# Patient Record
Sex: Female | Born: 1970 | Hispanic: Yes | Marital: Married | Smoking: Never smoker
Health system: Southern US, Community
[De-identification: ages and names within clinical notes are randomized; demographics above are authoritative.]

## PROBLEM LIST (undated history)

## (undated) DIAGNOSIS — R87619 Unspecified abnormal cytological findings in specimens from cervix uteri: Secondary | ICD-10-CM

## (undated) DIAGNOSIS — G43909 Migraine, unspecified, not intractable, without status migrainosus: Secondary | ICD-10-CM

## (undated) DIAGNOSIS — F419 Anxiety disorder, unspecified: Secondary | ICD-10-CM

## (undated) DIAGNOSIS — K828 Other specified diseases of gallbladder: Secondary | ICD-10-CM

## (undated) DIAGNOSIS — D649 Anemia, unspecified: Secondary | ICD-10-CM

## (undated) DIAGNOSIS — T7840XA Allergy, unspecified, initial encounter: Secondary | ICD-10-CM

## (undated) DIAGNOSIS — J020 Streptococcal pharyngitis: Secondary | ICD-10-CM

## (undated) HISTORY — DX: Anxiety disorder, unspecified: F41.9

## (undated) HISTORY — DX: Streptococcal pharyngitis: J02.0

## (undated) HISTORY — DX: Other specified diseases of gallbladder: K82.8

## (undated) HISTORY — DX: Migraine, unspecified, not intractable, without status migrainosus: G43.909

## (undated) HISTORY — DX: Anemia, unspecified: D64.9

## (undated) HISTORY — DX: Allergy, unspecified, initial encounter: T78.40XA

## (undated) HISTORY — DX: Unspecified abnormal cytological findings in specimens from cervix uteri: R87.619

---

## 2012-08-22 ENCOUNTER — Ambulatory Visit (INDEPENDENT_AMBULATORY_CARE_PROVIDER_SITE_OTHER): Payer: PRIVATE HEALTH INSURANCE | Admitting: Family Medicine

## 2012-08-22 ENCOUNTER — Encounter: Payer: Self-pay | Admitting: Family Medicine

## 2012-08-22 VITALS — BP 130/80 | HR 138 | Temp 98.2°F | Ht 62.5 in | Wt 180.0 lb

## 2012-08-22 DIAGNOSIS — R1013 Epigastric pain: Secondary | ICD-10-CM | POA: Insufficient documentation

## 2012-08-22 DIAGNOSIS — Z7189 Other specified counseling: Secondary | ICD-10-CM

## 2012-08-22 DIAGNOSIS — IMO0001 Reserved for inherently not codable concepts without codable children: Secondary | ICD-10-CM

## 2012-08-22 DIAGNOSIS — Z7689 Persons encountering health services in other specified circumstances: Secondary | ICD-10-CM

## 2012-08-22 DIAGNOSIS — Z309 Encounter for contraceptive management, unspecified: Secondary | ICD-10-CM

## 2012-08-22 LAB — HEMOGLOBIN A1C: Hgb A1c MFr Bld: 5.5 % (ref 4.6–6.5)

## 2012-08-22 NOTE — Progress Notes (Signed)
Chief Complaint  Patient presents with  . Establish Care    HPI:  Marcine Obi is here to establish care. Moved here from [place] about 1.5 years ago. Last PCP and physical: last physical about 2 years ago and was normal [facility] [place] [place]), hx of abnormal pap  Work: Audiological scientist Home Situation: lives with two children (44yo and 74 yo - coming to see me next week), father in [place] - they are separated Spiritual Beliefs: catholic Lifestyle: no regular cardiovascular, trying to eat a healthy diet  Has the following chronic problems and concerns today:  Patient Active Problem List  Diagnosis  . Epigastric pain  . Contraception  -has happened 3 times in the last  -occurs only at night -short lived -no nausea, vomiting, dysphagia, weight loss, change in bowels -regular periods, FDLMP 08/17/11, is sexually active -using injected birth control from [place] that she does herself but runs out the end of February - wants to do depo here  Health Maintenance: -had flu in nov 2013 -tdap in 2008  ROS: See pertinent positives and negatives per HPI.  History reviewed. No pertinent past medical history.  No family history on file.  History   Social History  . Marital Status: Single    Spouse Name: N/A    Number of Children: N/A  . Years of Education: N/A   Social History Main Topics  . Smoking status: Never Smoker   . Smokeless tobacco: None  . Alcohol Use: Yes     Comment: rare  . Drug Use: None  . Sexually Active: None   Other Topics Concern  . None   Social History Narrative  . None    No current outpatient prescriptions on file.  EXAM:  Filed Vitals:   08/22/12 1533  BP: 130/80  Pulse: 138  Temp: 98.2 F (36.8 C)    Body mass index is 32.40 kg/(m^2).  GENERAL: vitals reviewed and listed above, alert, oriented, appears well hydrated and in no acute distress  HEENT: atraumatic, conjunttiva clear, no obvious abnormalities on inspection of external  nose and ears  NECK: no obvious masses on inspection  LUNGS: clear to auscultation bilaterally, no wheezes, rales or rhonchi, good air movement  CV: HRRR, no peripheral edema  MS: moves all extremities without noticeable abnormality  PSYCH: pleasant and cooperative, no obvious depression or anxiety  ASSESSMENT AND PLAN:  Discussed the following assessment and plan:  1. Epigastric pain  -rare, brief, likley reflux since occurs at night -tums, journal, lifestyle changes - checking cmp -if worsens or persist will have her see GI CMP  2. Contraception  -will start dep at CPE   3. Establishing care with new doctor, encounter for  -CPE in one month CMP, Lipid Panel, Hemoglobin A1c  NON-FASTING LABS TODAY  -We reviewed the PMH, PSH, FH, SH, Meds and Allergies. -We addressed current concerns per orders and patient instructions. -We have asked for records for pertinent exams, studies, vaccines and notes from previous providers. -We have advised patient to follow up per instructions below.  -Patient advised to return or notify a doctor immediately if symptoms worsen or persist or new concerns arise.  Patient Instructions  -We have ordered labs or studies at this visit. It can take up to 1-2 weeks for results and processing. We will contact you with instructions IF your results are abnormal. Normal results will be released to your [facility]. If you have not heard from [place] or can not find your results  in [facility] in 2 weeks please contact our office.  -PLEASE SIGN UP FOR MYCHART TODAY   We recommend the following healthy lifestyle measures: - eat a healthy diet consisting of lots of vegetables, fruits, beans, nuts, seeds, healthy meats such as white chicken and fish and whole grains.  - avoid fried foods, fast food, processed foods, sodas, red meet and other fattening foods.  - get a least 150 minutes of aerobic exercise per week.   Follow up in: 1 month for CPE      Ezekiel Menzer  R.

## 2012-08-22 NOTE — Patient Instructions (Addendum)
-  We have ordered labs or studies at this visit. It can take up to 1-2 weeks for results and processing. We will contact you with instructions IF your results are abnormal. Normal results will be released to your [facility]. If you have not heard from [place] or can not find your results in [facility] in 2 weeks please contact our office.  -PLEASE SIGN UP FOR MYCHART TODAY   We recommend the following healthy lifestyle measures: - eat a healthy diet consisting of lots of vegetables, fruits, beans, nuts, seeds, healthy meats such as white chicken and fish and whole grains.  - avoid fried foods, fast food, processed foods, sodas, red meet and other fattening foods.  - get a least 150 minutes of aerobic exercise per week.   Follow up in: 1 month for CPE

## 2012-08-23 LAB — COMPREHENSIVE METABOLIC PANEL
ALT: 17 U/L (ref 0–35)
Albumin: 3.7 g/dL (ref 3.5–5.2)
CO2: 30 mEq/L (ref 19–32)
Calcium: 9.4 mg/dL (ref 8.4–10.5)
Chloride: 106 mEq/L (ref 96–112)
GFR: 78.43 mL/min (ref >60.00)
Glucose, Bld: 130 mg/dL — ABNORMAL HIGH (ref 70–99)
Potassium: 5 mEq/L (ref 3.5–5.1)
Sodium: 141 mEq/L (ref 135–145)
Total Bilirubin: 0.6 mg/dL (ref 0.3–1.2)
Total Protein: 7.9 g/dL (ref 6.0–8.3)

## 2012-08-23 LAB — LIPID PANEL: Cholesterol: 197 mg/dL (ref 0–200)

## 2012-09-22 ENCOUNTER — Encounter: Payer: PRIVATE HEALTH INSURANCE | Admitting: Family Medicine

## 2012-09-26 ENCOUNTER — Ambulatory Visit (INDEPENDENT_AMBULATORY_CARE_PROVIDER_SITE_OTHER): Payer: PRIVATE HEALTH INSURANCE | Admitting: Family Medicine

## 2012-09-26 ENCOUNTER — Encounter: Payer: Self-pay | Admitting: Family Medicine

## 2012-09-26 ENCOUNTER — Other Ambulatory Visit (HOSPITAL_COMMUNITY)
Admission: RE | Admit: 2012-09-26 | Discharge: 2012-09-26 | Disposition: A | Discharge status: Home / Self Care | Payer: PRIVATE HEALTH INSURANCE | Source: Ambulatory Visit | Attending: Family Medicine | Admitting: Family Medicine

## 2012-09-26 VITALS — BP 128/86 | HR 91 | Temp 98.4°F | Ht 62.5 in | Wt 182.0 lb

## 2012-09-26 DIAGNOSIS — Z01419 Encounter for gynecological examination (general) (routine) without abnormal findings: Secondary | ICD-10-CM | POA: Insufficient documentation

## 2012-09-26 DIAGNOSIS — Z0000 Encounter for general adult medical examination without abnormal findings: Secondary | ICD-10-CM

## 2012-09-26 DIAGNOSIS — Z309 Encounter for contraceptive management, unspecified: Secondary | ICD-10-CM

## 2012-09-26 DIAGNOSIS — Z1151 Encounter for screening for human papillomavirus (HPV): Secondary | ICD-10-CM | POA: Insufficient documentation

## 2012-09-26 NOTE — Progress Notes (Signed)
Chief Complaint  Patient presents with  . Annual Exam  . Cough    x 2 weeks;     HPI:  Here for CPE:  -Concerns today: mole on back - hasn't changed - has had for a long time - her mother thinks it has enlarged, sometimes itchy -wants to start depo shot - was on shot from [place] that she took every month (perlutal) - took last shot this month  -Diet: variety of foods, balance and well rounded  -Taking folic acid: no  -Exercise: elliptical 20 minutes daily  -Diabetes and Dyslipidemia Screening: done recently - mild dyslipidemia but not fasting  -Hx of HTN: no  -Vaccines: UTD  -pap history: ? Pap two years ago in [place], ? Remote abnormal pap with tx with cryo - normal since and reports normal pap two years ago in [place]  -FDLMP: 09/16/2012  -sexual activity: yes, female partner, no new partners  -wants STI testing: no  -FH breast, colon or ovarian ca: see FH  -Alcohol, Tobacco, drug use: see social history  Review of Systems - denies: CP, SOB, DOE, dizziness, unintentional weight loss, change in bowels, urinary or vaginal complaints, fevers, abnormal bleeding, worrisome skin lesions  Past Medical History  Diagnosis Date  . Migraines     Family History  Problem Relation Age of Onset  . Breast cancer      grandmother  . Hypertension      parent    History   Social History  . Marital Status: Single    Spouse Name: N/A    Number of Children: N/A  . Years of Education: N/A   Social History Main Topics  . Smoking status: Never Smoker   . Smokeless tobacco: None  . Alcohol Use: Yes     Comment: rare  . Drug Use: None  . Sexually Active: None   Other Topics Concern  . None   Social History Narrative  . None    No current outpatient prescriptions on file.  EXAM:  Filed Vitals:   09/26/12 1620  BP: 128/86  Pulse: 91  Temp: 98.4 F (36.9 C)    GENERAL: vitals reviewed and listed below, alert, oriented, appears well hydrated and in no acute  distress  HEENT: head atraumatic, PERRLA, normal appearance of eyes, ears, nose and mouth. moist mucus membranes.  NECK: supple, no masses or lymphadenopathy  LUNGS: clear to auscultation bilaterally, no rales, rhonchi or wheeze  CV: HRRR, no peripheral edema or cyanosis, normal pedal pulses  BREAST: normal appearance, refused exam  ABDOMEN: bowel sounds normal, soft, non tender to palpation, no masses, no rebound or guarding  GU: normal appearance of external genitalia - no lesions or masses, normal vaginal mucosa - no abnormal discharge, cervix appears somewhat friable - no abnormal discharge, no masses or tenderness on palpation of uterus and ovaries.  RECTAL: refused  SKIN: circular 8mm firm hyperpigmented lesion L upper back that dimples when squeezed  MS: normal gait, moves all extremities normally  NEURO: CN II-XII grossly intact, normal muscle strength and sensation to light touch on extremities  PSYCH: normal affect, pleasant and cooperative  ASSESSMENT AND PLAN:  Discussed the following assessment and plan:  Routine general medical examination at a health care facility - Plan: Cytology - PAP  -42 yo female her for yearly physical/health maintenance:  -Discussed and advised all [place] preventive services health task force level A and B recommendations for age, sex and risks.  -discussed contraceptive options and risks -  pt prefers to do depo, upreg neg and depo given today after discussion of risks. Pt greatly prefers this method to all other options discussed. She will consider the mirena and will contact gyn if decides to do this.  -for skin lesion, appears to be benign dermatofibroma, but given concern may have changed, advised I can not 100% exclude other etiologies including cancer. Given on difficult area of back cosmetically for healing and size of lesion, after discussion of options she will see derm about this if she decides to have it removed. Phone numbers to  call to schedule appt provided.  -Advised at least 150 minutes of exercise per week and a healthy diet low in saturated fats and sweets and consisting of fresh fruits and vegetables, lean meats such as fish and white chicken and whole grains.  -labs, studies and vaccines per orders this encounter  No orders of the defined types were placed in this encounter.    Patient Instructions  We recommend the following healthy lifestyle measures: - eat a healthy diet consisting of lots of vegetables, fruits, beans, nuts, seeds, healthy meats such as white chicken and fish and whole grains.  - avoid fried foods, fast food, processed foods, sodas, red meet and other fattening foods.  - get a least 150 minutes of aerobic exercise per week.   For the lesion on your back - I would recommend since are unsure if has changed that you have the dermatologist look at it.  -We have ordered a pap smear at this visit. It can take up to 1-2 weeks for results and processing. We will contact you with instructions IF your results are abnormal. Normal results will be released to your [facility]. If you have not heard from [place] or can not find your results in [facility] in 2 weeks please contact our office.  -schedule a nurse visit in 12 weeks for your next depo shot  -follow up in 6 months         Patient advised to return to clinic immediately if symptoms worsen or persist or new concerns.  @[email or web address] @  No Follow-up on file.  [name] R.

## 2012-09-26 NOTE — Patient Instructions (Signed)
We recommend the following healthy lifestyle measures: - eat a healthy diet consisting of lots of vegetables, fruits, beans, nuts, seeds, healthy meats such as white chicken and fish and whole grains.  - avoid fried foods, fast food, processed foods, sodas, red meet and other fattening foods.  - get a least 150 minutes of aerobic exercise per week.   For the lesion on your back - I would recommend since are unsure if has changed that you have the dermatologist look at it.  -We have ordered a pap smear at this visit. It can take up to 1-2 weeks for results and processing. We will contact you with instructions IF your results are abnormal. Normal results will be released to your [facility]. If you have not heard from [place] or can not find your results in [facility] in 2 weeks please contact our office.  -schedule a nurse visit in 12 weeks for your next depo shot  -follow up in 6 months

## 2012-09-27 MED ORDER — MEDROXYPROGESTERONE ACETATE 150 MG/ML IM SUSP
150.0000 mg | Freq: Once | INTRAMUSCULAR | Status: AC
  Administered 2012-09-26: 150 mg via INTRAMUSCULAR

## 2012-09-27 NOTE — Addendum Note (Signed)
Addended by: Azucena Freed on: 09/27/2012 08:37 AM   Modules accepted: Orders

## 2012-10-01 ENCOUNTER — Telehealth: Payer: Self-pay | Admitting: Family Medicine

## 2012-10-01 NOTE — Telephone Encounter (Signed)
Pap smear was negative and HPV was negative. Advise repeating in 3 years.

## 2012-10-02 NOTE — Telephone Encounter (Signed)
Called and spoke with pt and pt is aware.  

## 2012-12-08 ENCOUNTER — Ambulatory Visit (INDEPENDENT_AMBULATORY_CARE_PROVIDER_SITE_OTHER): Payer: PRIVATE HEALTH INSURANCE

## 2012-12-08 DIAGNOSIS — Z309 Encounter for contraceptive management, unspecified: Secondary | ICD-10-CM

## 2012-12-08 MED ORDER — MEDROXYPROGESTERONE ACETATE 150 MG/ML IM SUSP
150.0000 mg | Freq: Once | INTRAMUSCULAR | Status: AC
  Administered 2012-12-08: 150 mg via INTRAMUSCULAR

## 2013-02-23 ENCOUNTER — Ambulatory Visit (INDEPENDENT_AMBULATORY_CARE_PROVIDER_SITE_OTHER): Payer: PRIVATE HEALTH INSURANCE

## 2013-02-23 DIAGNOSIS — Z309 Encounter for contraceptive management, unspecified: Secondary | ICD-10-CM

## 2013-02-23 MED ORDER — MEDROXYPROGESTERONE ACETATE 150 MG/ML IM SUSP
150.0000 mg | Freq: Once | INTRAMUSCULAR | Status: AC
  Administered 2013-02-23: 150 mg via INTRAMUSCULAR

## 2013-04-12 ENCOUNTER — Ambulatory Visit (INDEPENDENT_AMBULATORY_CARE_PROVIDER_SITE_OTHER): Payer: PRIVATE HEALTH INSURANCE | Admitting: Family Medicine

## 2013-04-12 ENCOUNTER — Encounter: Payer: Self-pay | Admitting: Family Medicine

## 2013-04-12 VITALS — BP 130/88 | Temp 98.4°F | Wt 191.0 lb

## 2013-04-12 DIAGNOSIS — R071 Chest pain on breathing: Secondary | ICD-10-CM

## 2013-04-12 DIAGNOSIS — R635 Abnormal weight gain: Secondary | ICD-10-CM

## 2013-04-12 DIAGNOSIS — IMO0001 Reserved for inherently not codable concepts without codable children: Secondary | ICD-10-CM

## 2013-04-12 DIAGNOSIS — R0789 Other chest pain: Secondary | ICD-10-CM

## 2013-04-12 DIAGNOSIS — Z309 Encounter for contraceptive management, unspecified: Secondary | ICD-10-CM

## 2013-04-12 LAB — TSH: TSH: 1.12 u[IU]/mL (ref 0.35–5.50)

## 2013-04-12 LAB — T4, FREE: Free T4: 1.12 ng/dL (ref 0.60–1.60)

## 2013-04-12 NOTE — Progress Notes (Signed)
Chief Complaint  Patient presents with  . Discuss BC options    HPI:  Acute visit for contraception/weight gain: -has been on depo shot -denies hx of blood clots, htn, migraine with aura -has had weight gain mild over last year -wonders if birth control, but want to continue depo -wants to check thyroid -denies: hot/cold flashes, malaise, bleeding  Cartilage pain: -has started doing elipitical and has had some soreness in L costal cartilage for 3 days  Health maintenace: -offered flu -advised mammogram -she is getting this in october ROS: See pertinent positives and negatives per HPI.  Past Medical History  Diagnosis Date  . Migraines     Past Surgical History  Procedure Laterality Date  . Cesarean section  2008    Family History  Problem Relation Age of Onset  . Breast cancer      grandmother  . Hypertension      parent    History   Social History  . Marital Status: Single    Spouse Name: N/A    Number of Children: N/A  . Years of Education: N/A   Social History Main Topics  . Smoking status: Never Smoker   . Smokeless tobacco: None  . Alcohol Use: Yes     Comment: rare  . Drug Use: None  . Sexual Activity: None   Other Topics Concern  . None   Social History Narrative  . None    No current outpatient prescriptions on file.  EXAM:  Filed Vitals:   04/12/13 1516  BP: 130/88  Temp: 98.4 F (36.9 C)    Body mass index is 34.36 kg/(m^2).  GENERAL: vitals reviewed and listed above, alert, oriented, appears well hydrated and in no acute distress  HEENT: atraumatic, conjunttiva clear, no obvious abnormalities on inspection of external nose and ears  NECK: no obvious masses on inspection  LUNGS: clear to auscultation bilaterally, no wheezes, rales or rhonchi, good air movement  CV: HRRR, no peripheral edema  Chest: TTP mild cartilage L chest wall  MS: moves all extremities without noticeable abnormality  PSYCH: pleasant and  cooperative, no obvious depression or anxiety  ASSESSMENT AND PLAN:  Discussed the following assessment and plan:  Weight gain - Plan: TSH, T4, Free  Contraception  Chest wall pain  -discussed weight, advised of options for birth control including non-hormonal options, she wants to continue with depo -advised 150-387minutes of CV exercise per week and low carb low fat healthy diet and portion control -checking thyroid labs -advised supportive measures for chest wall pain and return precuations -advised regular mammos and flu vaccine -Patient advised to return or notify a doctor immediately if symptoms worsen or persist or new concerns arise.  There are no Patient Instructions on file for this visit.   [name] R.

## 2013-04-13 NOTE — Progress Notes (Signed)
Quick Note:  Called and spoke with pt and pt is aware. ______ 

## 2013-05-18 ENCOUNTER — Ambulatory Visit (INDEPENDENT_AMBULATORY_CARE_PROVIDER_SITE_OTHER): Payer: PRIVATE HEALTH INSURANCE

## 2013-05-18 DIAGNOSIS — Z309 Encounter for contraceptive management, unspecified: Secondary | ICD-10-CM

## 2013-05-18 MED ORDER — MEDROXYPROGESTERONE ACETATE 150 MG/ML IM SUSP
150.0000 mg | Freq: Once | INTRAMUSCULAR | Status: AC
  Administered 2013-05-18: 150 mg via INTRAMUSCULAR

## 2013-05-31 ENCOUNTER — Encounter: Payer: Self-pay | Admitting: Family Medicine

## 2013-05-31 ENCOUNTER — Ambulatory Visit (INDEPENDENT_AMBULATORY_CARE_PROVIDER_SITE_OTHER): Payer: PRIVATE HEALTH INSURANCE | Admitting: Family Medicine

## 2013-05-31 VITALS — BP 108/80 | HR 76 | Temp 103.1°F | Ht 62.5 in | Wt 192.0 lb

## 2013-05-31 DIAGNOSIS — J020 Streptococcal pharyngitis: Secondary | ICD-10-CM

## 2013-05-31 DIAGNOSIS — J029 Acute pharyngitis, unspecified: Secondary | ICD-10-CM

## 2013-05-31 HISTORY — DX: Streptococcal pharyngitis: J02.0

## 2013-05-31 LAB — POCT RAPID STREP A (OFFICE): Rapid Strep A Screen: POSITIVE — AB

## 2013-05-31 MED ORDER — IBUPROFEN 200 MG PO TABS: 400.0000 mg | ORAL_TABLET | Freq: Every day | ORAL | Status: DC | PRN

## 2013-05-31 MED ORDER — CEFDINIR 300 MG PO CAPS: 300.0000 mg | ORAL_CAPSULE | Freq: Two times a day (BID) | ORAL | Status: AC

## 2013-05-31 NOTE — Assessment & Plan Note (Signed)
Rapid strep positive with fever and pain, started on Cefdinir and probiotics, increase rest and hydration off work through [employer]

## 2013-05-31 NOTE — Patient Instructions (Signed)
Strep Throat Strep throat is an infection of the throat caused by a bacteria named Streptococcus pyogenes. Your caregiver may call the infection streptococcal "tonsillitis" or "pharyngitis" depending on whether there are signs of inflammation in the tonsils or back of the throat. Strep throat is most common in children aged 44 15 years during the cold months of the year, but it can occur in people of any age during any season. This infection is spread from person to person (contagious) through coughing, sneezing, or other close contact. SYMPTOMS   Fever or chills.  Painful, swollen, red tonsils or throat.  Pain or difficulty when swallowing.  White or yellow spots on the tonsils or throat.  Swollen, tender lymph nodes or "glands" of the neck or under the jaw.  Red rash all over the body (rare). DIAGNOSIS  Many different infections can cause the same symptoms. A test must be done to confirm the diagnosis so the right treatment can be given. A "rapid strep test" can help your caregiver make the diagnosis in a few minutes. If this test is not available, a light swab of the infected area can be used for a throat culture test. If a throat culture test is done, results are usually available in a day or two. TREATMENT  Strep throat is treated with antibiotic medicine. HOME CARE INSTRUCTIONS   Gargle with 1 tsp of salt in 1 cup of warm water, 3 4 times per day or as needed for comfort.  Family members who also have a sore throat or fever should be tested for strep throat and treated with antibiotics if they have the strep infection.  Make sure everyone in your household washes their hands well.  Do not share food, drinking cups, or personal items that could cause the infection to spread to others.  You may need to eat a soft food diet until your sore throat gets better.  Drink enough water and fluids to keep your urine clear or pale yellow. This will help prevent dehydration.  Get plenty of  rest.  Stay home from school, daycare, or work until you have been on antibiotics for 24 hours.  Only take over-the-counter or prescription medicines for pain, discomfort, or fever as directed by your caregiver.  If antibiotics are prescribed, take them as directed. Finish them even if you start to feel better. SEEK MEDICAL CARE IF:   The glands in your neck continue to enlarge.  You develop a rash, cough, or earache.  You cough up green, yellow-brown, or bloody sputum.  You have pain or discomfort not controlled by medicines.  Your problems seem to be getting worse rather than better. SEEK IMMEDIATE MEDICAL CARE IF:   You develop any new symptoms such as vomiting, severe headache, stiff or painful neck, chest pain, shortness of breath, or trouble swallowing.  You develop severe throat pain, drooling, or changes in your voice.  You develop swelling of the neck, or the skin on the neck becomes red and tender.  You have a fever.  You develop signs of dehydration, such as fatigue, dry mouth, and decreased urination.  You become increasingly sleepy, or you cannot wake up completely. Document Released: 07/23/2000 Document Revised: 07/12/2012 Document Reviewed: 09/24/2010 [facility] Patient Information 2014 [place], [place]. Strep Infections Streptococcal (strep) infections are caused by streptococcal germs (bacteria). Strep infections are very contagious. Strep infections can occur in:  Ears.  The nose.  The throat.  Sinuses.  Skin.  Blood.  Lungs.  Spinal fluid.  Urine. Strep throat is the most common bacterial infection in children. The symptoms of a Strep infection usually get better in 2 to 3 days after starting medicine that kills germs (antibiotics). Strep is usually not contagious after 36 to 48 hours of antibiotic treatment. Strep infections that are not treated can cause serious complications. These include gland infections, throat abscess, rheumatic fever and  kidney disease. DIAGNOSIS  The diagnosis of strep is made by:  A culture for the strep germ. TREATMENT  These infections require oral antibiotics for a full 10 days, an antibiotic shot or antibiotics given into the vein (intravenous, IV). HOME CARE INSTRUCTIONS   Be sure to finish all antibiotics even if feeling better.  Only take over-the-counter medicines for pain, discomfort and or fever, as directed by your caregiver.  Close contacts that have a fever, sore throat or illness symptoms should see their caregiver right away.  You or your child may return to work, school or daycare if the fever and pain are better in 2 to 3 days after starting antibiotics. SEEK MEDICAL CARE IF:   You or your child has an oral temperature above 102 F (38.9 C).  Your baby is older than 3 months with a rectal temperature of 100.5 F (38.1 C) or higher for more than 1 day.  You or your child is not better in 3 days. SEEK IMMEDIATE MEDICAL CARE IF:   You or your child has an oral temperature above 102 F (38.9 C), not controlled by medicine.  Your baby is older than 3 months with a rectal temperature of 102 F (38.9 C) or higher.  Your baby is 68 months old or younger with a rectal temperature of 100.4 F (38 C) or higher.  There is a spreading rash.  There is difficulty swallowing or breathing.  There is increased pain or swelling. Document Released: 09/02/2004 Document Revised: 10/18/2011 Document Reviewed: 06/11/2009 [facility] Patient Information 2014 [place], [place].

## 2013-05-31 NOTE — Progress Notes (Signed)
Patient ID: [name], female   DOB: Mar 04, 1971, 42 y.o.   MRN: [id number] Ariel Hudson [id number] 09-14-1970 05/31/2013      Progress Note-Follow Up  Subjective  Chief Complaint  Chief Complaint  Patient presents with  . Sore Throat    X 2 days  . Fever    X 2days  . Generalized Body Aches    X 2 days    HPI  The patient is a 42 year old Hispanic female who is in today complaining of 2 days worth of worsening sore throat, fevers, chills, malaise and myalgias. She has a low grade headache but no significant cough, ear pain, congestion. No chest congestion, chest pain shortness of breath, diarrhea or vomiting. She has been working long hours and sleeping poorly so she does believe this has contributed to her current illness  Past Medical History  Diagnosis Date  . Migraines   . Streptococcal sore throat 05/31/2013    Past Surgical History  Procedure Laterality Date  . Cesarean section  2008    Family History  Problem Relation Age of Onset  . Breast cancer      grandmother  . Hypertension      parent    History   Social History  . Marital Status: Single    Spouse Name: N/A    Number of Children: N/A  . Years of Education: N/A   Occupational History  . Not on file.   Social History Main Topics  . Smoking status: Never Smoker   . Smokeless tobacco: Not on file  . Alcohol Use: Yes     Comment: rare  . Drug Use: Not on file  . Sexual Activity: Not on file   Other Topics Concern  . Not on file   Social History Narrative  . No narrative on file    No current outpatient prescriptions on file prior to visit.   No current facility-administered medications on file prior to visit.    No Known Allergies  Review of Systems  Review of Systems  Constitutional: Positive for fever and malaise/fatigue.  HENT: Positive for sore throat. Negative for congestion.   Eyes: Negative for discharge.  Respiratory: Negative for shortness of breath.    Cardiovascular: Negative for chest pain, palpitations and leg swelling.  Gastrointestinal: Negative for nausea, abdominal pain and diarrhea.  Genitourinary: Negative for dysuria.  Musculoskeletal: Positive for myalgias. Negative for falls.  Skin: Negative for rash.  Neurological: Positive for headaches. Negative for loss of consciousness.  Endo/Heme/Allergies: Negative for polydipsia.  Psychiatric/Behavioral: Negative for depression and suicidal ideas. The patient is not nervous/anxious and does not have insomnia.     Objective  BP 108/80  Pulse 76  Temp(Src) 103.1 F (39.5 C) (Oral)  Ht 5' 2.5" (1.588 m)  Wt 192 lb (87.091 kg)  BMI 34.54 kg/m2  SpO2 98%  Physical Exam  Physical Exam  Constitutional: She is oriented to person, place, and time and well-developed, well-nourished, and in no distress. No distress.  HENT:  Head: Normocephalic and atraumatic.  Oropharynx erythematous and swollen no exudate  Eyes: Conjunctivae are normal.  Neck: Neck supple. No thyromegaly present.  Cardiovascular: Normal rate, regular rhythm and normal heart sounds.   No murmur heard. Pulmonary/Chest: Effort normal and breath sounds normal. She has no wheezes.  Abdominal: She exhibits no distension and no mass.  Musculoskeletal: She exhibits no edema.  Lymphadenopathy:    She has cervical adenopathy.  Neurological: She is alert and oriented to person, place, and  time.  Skin: Skin is warm and dry. No rash noted. She is not diaphoretic.  Psychiatric: Memory, affect and judgment normal.    Lab Results  Component Value Date   TSH 1.12 04/12/2013   No results found for this basename: WBC, HGB, HCT, MCV, PLT   Lab Results  Component Value Date   CREATININE 0.8 08/22/2012   BUN 9 08/22/2012   NA 141 08/22/2012   K 5.0 08/22/2012   CL 106 08/22/2012   CO2 30 08/22/2012   Lab Results  Component Value Date   ALT 17 08/22/2012   AST 19 08/22/2012   ALKPHOS 98 08/22/2012   BILITOT 0.6 08/22/2012    Lab Results  Component Value Date   CHOL 197 08/22/2012   Lab Results  Component Value Date   HDL 50.20 08/22/2012   Lab Results  Component Value Date   LDLCALC 114* 08/22/2012   Lab Results  Component Value Date   TRIG 166.0* 08/22/2012   Lab Results  Component Value Date   CHOLHDL 4 08/22/2012     Assessment & Plan  Streptococcal sore throat Rapid strep positive with fever and pain, started on Cefdinir and probiotics, increase rest and hydration off work through [employer]

## 2013-06-01 ENCOUNTER — Ambulatory Visit: Payer: PRIVATE HEALTH INSURANCE | Admitting: Family Medicine

## 2013-06-12 ENCOUNTER — Encounter: Payer: Self-pay | Admitting: Family Medicine

## 2013-06-14 ENCOUNTER — Other Ambulatory Visit: Payer: Self-pay

## 2013-07-30 ENCOUNTER — Ambulatory Visit (INDEPENDENT_AMBULATORY_CARE_PROVIDER_SITE_OTHER): Payer: PRIVATE HEALTH INSURANCE

## 2013-07-30 DIAGNOSIS — Z309 Encounter for contraceptive management, unspecified: Secondary | ICD-10-CM

## 2013-07-30 MED ORDER — MEDROXYPROGESTERONE ACETATE 150 MG/ML IM SUSP
150.0000 mg | Freq: Once | INTRAMUSCULAR | Status: AC
  Administered 2013-07-30: 150 mg via INTRAMUSCULAR

## 2013-10-10 ENCOUNTER — Telehealth: Payer: Self-pay | Admitting: Family Medicine

## 2013-10-10 NOTE — Telephone Encounter (Signed)
Pt called wanting to schedule her next depo shot but could not remember when you told her to come.  Her last shot was 07/30/13.  Please advise

## 2013-10-11 NOTE — Telephone Encounter (Signed)
March 9th - March 23rd.  Called to make pt aware. Pt is aware of appt on 10/22/13 at 8 am.

## 2013-10-22 ENCOUNTER — Ambulatory Visit (INDEPENDENT_AMBULATORY_CARE_PROVIDER_SITE_OTHER): Payer: PRIVATE HEALTH INSURANCE

## 2013-10-22 DIAGNOSIS — Z309 Encounter for contraceptive management, unspecified: Secondary | ICD-10-CM

## 2013-10-22 MED ORDER — MEDROXYPROGESTERONE ACETATE 150 MG/ML IM SUSP
150.0000 mg | Freq: Once | INTRAMUSCULAR | Status: AC
Start: 2013-10-22 — End: 2013-10-22
  Administered 2013-10-22: 150 mg via INTRAMUSCULAR

## 2014-01-18 ENCOUNTER — Ambulatory Visit (INDEPENDENT_AMBULATORY_CARE_PROVIDER_SITE_OTHER): Payer: PRIVATE HEALTH INSURANCE | Admitting: *Deleted

## 2014-01-18 DIAGNOSIS — Z309 Encounter for contraceptive management, unspecified: Secondary | ICD-10-CM

## 2014-01-18 MED ORDER — MEDROXYPROGESTERONE ACETATE 150 MG/ML IM SUSP
150.0000 mg | Freq: Once | INTRAMUSCULAR | Status: AC
  Administered 2014-01-18: 150 mg via INTRAMUSCULAR

## 2014-04-23 ENCOUNTER — Telehealth: Payer: Self-pay | Admitting: *Deleted

## 2014-04-23 ENCOUNTER — Ambulatory Visit (INDEPENDENT_AMBULATORY_CARE_PROVIDER_SITE_OTHER): Payer: PRIVATE HEALTH INSURANCE | Admitting: *Deleted

## 2014-04-23 DIAGNOSIS — Z3200 Encounter for pregnancy test, result unknown: Secondary | ICD-10-CM

## 2014-04-23 DIAGNOSIS — Z3049 Encounter for surveillance of other contraceptives: Secondary | ICD-10-CM

## 2014-04-23 DIAGNOSIS — Z3042 Encounter for surveillance of injectable contraceptive: Secondary | ICD-10-CM

## 2014-04-23 LAB — POCT URINE PREGNANCY: Preg Test, Ur: NEGATIVE

## 2014-04-23 MED ORDER — MEDROXYPROGESTERONE ACETATE 150 MG/ML IM SUSP
150.0000 mg | Freq: Once | INTRAMUSCULAR | Status: AC
  Administered 2014-04-23: 150 mg via INTRAMUSCULAR

## 2014-04-23 NOTE — Telephone Encounter (Signed)
Patient came in today for a Depo-Provera injection and I advised her she was due on 9/11 and a urine pregnancy test was done per protocol.  She stated she had intercourse 2 days ago. The urine pregnancy test was negative and the pt was informed she could be pregnant and she decided to have the injection today and declines a repeat pregnancy test in 2 weeks.  She was informed to use a back up method for the next 7 days and agreed to this.

## 2014-05-17 ENCOUNTER — Ambulatory Visit (INDEPENDENT_AMBULATORY_CARE_PROVIDER_SITE_OTHER): Payer: PRIVATE HEALTH INSURANCE | Admitting: *Deleted

## 2014-05-17 ENCOUNTER — Ambulatory Visit: Payer: PRIVATE HEALTH INSURANCE | Admitting: *Deleted

## 2014-05-17 DIAGNOSIS — Z23 Encounter for immunization: Secondary | ICD-10-CM

## 2014-06-10 ENCOUNTER — Encounter: Payer: Self-pay | Admitting: Family Medicine

## 2014-06-27 ENCOUNTER — Telehealth: Payer: Self-pay | Admitting: Family Medicine

## 2014-06-27 NOTE — Telephone Encounter (Signed)
Please let the patient know I will try to get to her but this is not a good time since we always have a patient at 8am and 8:15-so she may have a wait.  Its best to come in at 9am.

## 2014-06-27 NOTE — Telephone Encounter (Signed)
Pt would like to arrive at 8:10 for her depo inj, s that ok Ariel Hudson/you

## 2014-07-12 ENCOUNTER — Ambulatory Visit (INDEPENDENT_AMBULATORY_CARE_PROVIDER_SITE_OTHER): Payer: PRIVATE HEALTH INSURANCE | Admitting: *Deleted

## 2014-07-12 DIAGNOSIS — Z309 Encounter for contraceptive management, unspecified: Secondary | ICD-10-CM

## 2014-07-12 MED ORDER — MEDROXYPROGESTERONE ACETATE 150 MG/ML IM SUSP
150.0000 mg | Freq: Once | INTRAMUSCULAR | Status: AC
  Administered 2014-07-12: 150 mg via INTRAMUSCULAR

## 2014-09-25 ENCOUNTER — Telehealth: Payer: Self-pay | Admitting: *Deleted

## 2014-09-25 NOTE — Telephone Encounter (Signed)
I called the pt and left a detailed message for her to call the office to reschedule the Depo-Provera injection for Friday (2/19) instead of Thursday as this would be too early.

## 2014-09-26 ENCOUNTER — Ambulatory Visit: Payer: PRIVATE HEALTH INSURANCE | Admitting: *Deleted

## 2014-10-02 ENCOUNTER — Ambulatory Visit (INDEPENDENT_AMBULATORY_CARE_PROVIDER_SITE_OTHER): Payer: BLUE CROSS/BLUE SHIELD | Admitting: *Deleted

## 2014-10-02 DIAGNOSIS — Z308 Encounter for other contraceptive management: Secondary | ICD-10-CM

## 2014-10-02 MED ORDER — MEDROXYPROGESTERONE ACETATE 150 MG/ML IM SUSP
150.0000 mg | Freq: Once | INTRAMUSCULAR | Status: AC
  Administered 2014-10-02: 150 mg via INTRAMUSCULAR

## 2014-11-22 ENCOUNTER — Telehealth: Payer: Self-pay | Admitting: Family Medicine

## 2014-11-22 NOTE — Telephone Encounter (Signed)
Pt would like to know the date range she needs to come in for her depo inj.  Pt lost her info you gave her.

## 2014-11-22 NOTE — Telephone Encounter (Signed)
Dr Remonia RichterKim-when is the pt due for an office visit with you?  Last seen 2014.

## 2014-11-22 NOTE — Telephone Encounter (Signed)
[facility] as I could not find an opening during the date of 5/12-5/26 for her to come in at the same time she is due for next Depo-Provera injection.

## 2014-11-22 NOTE — Telephone Encounter (Signed)
Needs CPE 

## 2014-11-22 NOTE — Telephone Encounter (Signed)
Your first available am appt is in

## 2014-12-23 ENCOUNTER — Ambulatory Visit (INDEPENDENT_AMBULATORY_CARE_PROVIDER_SITE_OTHER): Payer: BLUE CROSS/BLUE SHIELD | Admitting: *Deleted

## 2014-12-23 DIAGNOSIS — Z3042 Encounter for surveillance of injectable contraceptive: Secondary | ICD-10-CM | POA: Diagnosis not present

## 2014-12-23 MED ORDER — MEDROXYPROGESTERONE ACETATE 150 MG/ML IM SUSP
150.0000 mg | Freq: Once | INTRAMUSCULAR | Status: AC
  Administered 2014-12-23: 150 mg via INTRAMUSCULAR

## 2015-03-11 ENCOUNTER — Encounter: Payer: Self-pay | Admitting: Family Medicine

## 2015-03-11 ENCOUNTER — Ambulatory Visit (INDEPENDENT_AMBULATORY_CARE_PROVIDER_SITE_OTHER): Payer: BLUE CROSS/BLUE SHIELD | Admitting: Family Medicine

## 2015-03-11 VITALS — BP 102/78 | HR 86 | Temp 98.3°F | Ht 63.0 in | Wt 182.8 lb

## 2015-03-11 DIAGNOSIS — Z309 Encounter for contraceptive management, unspecified: Secondary | ICD-10-CM | POA: Diagnosis not present

## 2015-03-11 DIAGNOSIS — E669 Obesity, unspecified: Secondary | ICD-10-CM

## 2015-03-11 DIAGNOSIS — Z0000 Encounter for general adult medical examination without abnormal findings: Secondary | ICD-10-CM | POA: Diagnosis not present

## 2015-03-11 LAB — COMPREHENSIVE METABOLIC PANEL
ALBUMIN: 4.1 g/dL (ref 3.5–5.2)
ALT: 16 U/L (ref 0–35)
AST: 15 U/L (ref 0–37)
Alkaline Phosphatase: 99 U/L (ref 39–117)
BILIRUBIN TOTAL: 0.9 mg/dL (ref 0.2–1.2)
BUN: 9 mg/dL (ref 6–23)
CO2: 27 mEq/L (ref 19–32)
Calcium: 9.8 mg/dL (ref 8.4–10.5)
Chloride: 107 mEq/L (ref 96–112)
Creatinine, Ser: 0.81 mg/dL (ref 0.40–1.20)
GFR: 80.86 mL/min (ref >60.00)
Glucose, Bld: 102 mg/dL — ABNORMAL HIGH (ref 70–99)
POTASSIUM: 5.2 meq/L — AB (ref 3.5–5.1)
Sodium: 141 mEq/L (ref 135–145)
TOTAL PROTEIN: 7.9 g/dL (ref 6.0–8.3)

## 2015-03-11 LAB — LIPID PANEL
CHOL/HDL RATIO: 4
Cholesterol: 182 mg/dL (ref 0–200)
HDL: 48.8 mg/dL (ref >39.00)
LDL CALC: 113 mg/dL — AB (ref 0–99)
NonHDL: 133.61
TRIGLYCERIDES: 102 mg/dL (ref 0.0–149.0)
VLDL: 20.4 mg/dL (ref 0.0–40.0)

## 2015-03-11 LAB — HEMOGLOBIN A1C: Hgb A1c MFr Bld: 5.3 % (ref 4.6–6.5)

## 2015-03-11 MED ORDER — MEDROXYPROGESTERONE ACETATE 150 MG/ML IM SUSP
150.0000 mg | Freq: Once | INTRAMUSCULAR | Status: AC
  Administered 2015-03-11: 150 mg via INTRAMUSCULAR

## 2015-03-11 NOTE — Patient Instructions (Signed)
BEFORE YOU LEAVE: -labs  Vit D3 1000 IU daily  -We have ordered labs or studies at this visit. It can take up to 1-2 weeks for results and processing. We will contact you with instructions IF your results are abnormal. Normal results will be released to your [facility]. If you have not heard from [place] or can not find your results in [facility] in 2 weeks please contact our office.  -PLEASE SIGN UP FOR MYCHART TODAY   We recommend the following healthy lifestyle measures: - eat a healthy diet consisting of lots of vegetables, fruits, beans, nuts, seeds, healthy meats such as white chicken and fish and whole grains.  - avoid fried foods, fast food, processed foods, sodas, red meet and other fattening foods.  - get a least 150 minutes of aerobic exercise per week.

## 2015-03-11 NOTE — Addendum Note (Signed)
Addended by: Johnella Moloney on: 03/11/2015 08:46 AM   Modules accepted: Orders

## 2015-03-11 NOTE — Progress Notes (Signed)
Pre visit review using our clinic review tool, if applicable. No additional management support is needed unless otherwise documented below in the visit note. 

## 2015-03-11 NOTE — Progress Notes (Signed)
HPI:  Here for CPE. Has not been seen in almost 2 years.  -Concerns and/or follow up today: none  -Diet: variety of foods, balance and well rounded, larger portion sizes  -Exercise: no regular exercise  -Taking folic acid, vitamin D or calcium: no  -Diabetes and Dyslipidemia Screening: doing today  -Hx of HTN: no  -Vaccines: UTD - flu vaccine not yet avaible  -pap history: 09/2012, normal, HPV negative  -FDLMP:  No periods, on depo  -sexual activity: yes, female partner, no new partners  -wants STI testing (Hep C if born [birth year]): no  -FH breast, colon or ovarian ca: see FH Last mammogram: done Last colon cancer screening: n/a  Breast Ca Risk Assessment: -no sig FH or personal history, pt does self breast exams, she did a mammogram 1.5 years ago  -Alcohol, Tobacco, drug use: see social history  Review of Systems - no fevers, unintentional weight loss, vision loss, hearing loss, chest pain, sob, hemoptysis, melena, hematochezia, hematuria, genital discharge, changing or concerning skin lesions, bleeding, bruising, loc, thoughts of self harm or SI  Past Medical History  Diagnosis Date  . Migraines   . Streptococcal sore throat 05/31/2013  . Gallbladder sludge     dx in 2015 per pt, pt refused surgery    Past Surgical History  Procedure Laterality Date  . Cesarean section  2008    Family History  Problem Relation Age of Onset  . Breast cancer      grandmother  . Hypertension Father   . Heart defect Mother     recently admitted to [facility] per patient-needs valve replacement    History   Social History  . Marital Status: Single    Spouse Name: N/A  . Number of Children: N/A  . Years of Education: N/A   Social History Main Topics  . Smoking status: Never Smoker   . Smokeless tobacco: Not on file  . Alcohol Use: Yes     Comment: rare  . Drug Use: Not on file  . Sexual Activity: Not on file   Other Topics Concern  . None   Social History  Narrative     Current outpatient prescriptions:  .  medroxyPROGESTERone (DEPO-PROVERA) 150 MG/ML injection, Inject 150 mg into the muscle every 3 (three) months., Disp: , Rfl:   EXAM:  Filed Vitals:   03/11/15 0759  BP: 102/78  Pulse: 86  Temp: 98.3 F (36.8 C)    GENERAL: vitals reviewed and listed below, alert, oriented, appears well hydrated and in no acute distress  HEENT: head atraumatic, PERRLA, normal appearance of eyes, ears, nose and mouth. moist mucus membranes.  NECK: supple, no masses or lymphadenopathy  LUNGS: clear to auscultation bilaterally, no rales, rhonchi or wheeze  CV: HRRR, no peripheral edema or cyanosis, normal pedal pulses  BREAST: normal appearance - no lesions or discharge, on palpation normal breast tissue without any suspicious masses  ABDOMEN: bowel sounds normal, soft, non tender to palpation, no masses, no rebound or guarding  GU: declined  RECTAL: refused  SKIN: no rash or abnormal lesions  MS: normal gait, moves all extremities normally  NEURO: CN II-XII grossly intact, normal muscle strength and sensation to light touch on extremities  PSYCH: normal affect, pleasant and cooperative  ASSESSMENT AND PLAN:  Discussed the following assessment and plan:  There are no diagnoses linked to this encounter.  -Discussed and advised all [place] preventive services health task force level A and B recommendations for age, sex and  risks.  -Advised at least 150 minutes of exercise per week and a healthy diet low in saturated fats and sweets and consisting of fresh fruits and vegetables, lean meats such as fish and white chicken and whole grains.  -FASTING labs, studies and vaccines per orders this encounter  Orders Placed This Encounter  Procedures  . Hemoglobin A1c  . Lipid Panel  . CMP    Patient advised to return to clinic immediately if symptoms worsen or persist or new concerns.  Patient Instructions  BEFORE YOU LEAVE: -labs  Vit  D3 1000 IU daily  -We have ordered labs or studies at this visit. It can take up to 1-2 weeks for results and processing. We will contact you with instructions IF your results are abnormal. Normal results will be released to your [facility]. If you have not heard from [place] or can not find your results in [facility] in 2 weeks please contact our office.  -PLEASE SIGN UP FOR MYCHART TODAY   We recommend the following healthy lifestyle measures: - eat a healthy diet consisting of lots of vegetables, fruits, beans, nuts, seeds, healthy meats such as white chicken and fish and whole grains.  - avoid fried foods, fast food, processed foods, sodas, red meet and other fattening foods.  - get a least 150 minutes of aerobic exercise per week.      Return in about 1 year (around 03/10/2016) for CPE with pap.  [name] R.

## 2015-05-30 ENCOUNTER — Ambulatory Visit (INDEPENDENT_AMBULATORY_CARE_PROVIDER_SITE_OTHER): Payer: BLUE CROSS/BLUE SHIELD | Admitting: *Deleted

## 2015-05-30 DIAGNOSIS — Z309 Encounter for contraceptive management, unspecified: Secondary | ICD-10-CM | POA: Diagnosis not present

## 2015-05-30 MED ORDER — MEDROXYPROGESTERONE ACETATE 150 MG/ML IM SUSP
150.0000 mg | Freq: Once | INTRAMUSCULAR | Status: AC
  Administered 2015-05-30: 150 mg via INTRAMUSCULAR

## 2015-08-25 ENCOUNTER — Ambulatory Visit (INDEPENDENT_AMBULATORY_CARE_PROVIDER_SITE_OTHER): Payer: BLUE CROSS/BLUE SHIELD | Admitting: *Deleted

## 2015-08-25 DIAGNOSIS — Z3042 Encounter for surveillance of injectable contraceptive: Secondary | ICD-10-CM | POA: Diagnosis not present

## 2015-08-25 MED ORDER — MEDROXYPROGESTERONE ACETATE 150 MG/ML IM SUSP
150.0000 mg | Freq: Once | INTRAMUSCULAR | Status: AC
  Administered 2015-08-25: 150 mg via INTRAMUSCULAR

## 2015-11-17 ENCOUNTER — Ambulatory Visit (INDEPENDENT_AMBULATORY_CARE_PROVIDER_SITE_OTHER): Payer: BLUE CROSS/BLUE SHIELD | Admitting: *Deleted

## 2015-11-17 DIAGNOSIS — Z3042 Encounter for surveillance of injectable contraceptive: Secondary | ICD-10-CM | POA: Diagnosis not present

## 2015-11-17 MED ORDER — MEDROXYPROGESTERONE ACETATE 150 MG/ML IM SUSP
150.0000 mg | Freq: Once | INTRAMUSCULAR | Status: AC
  Administered 2015-11-17: 150 mg via INTRAMUSCULAR

## 2016-02-06 ENCOUNTER — Ambulatory Visit (INDEPENDENT_AMBULATORY_CARE_PROVIDER_SITE_OTHER): Payer: BLUE CROSS/BLUE SHIELD | Admitting: *Deleted

## 2016-02-06 DIAGNOSIS — Z3042 Encounter for surveillance of injectable contraceptive: Secondary | ICD-10-CM

## 2016-02-06 MED ORDER — MEDROXYPROGESTERONE ACETATE 150 MG/ML IM SUSP
150.0000 mg | Freq: Once | INTRAMUSCULAR | Status: AC
  Administered 2016-02-06: 150 mg via INTRAMUSCULAR

## 2016-03-11 ENCOUNTER — Ambulatory Visit (INDEPENDENT_AMBULATORY_CARE_PROVIDER_SITE_OTHER): Payer: BLUE CROSS/BLUE SHIELD | Admitting: Family Medicine

## 2016-03-11 ENCOUNTER — Encounter: Payer: Self-pay | Admitting: Family Medicine

## 2016-03-11 ENCOUNTER — Other Ambulatory Visit (HOSPITAL_COMMUNITY)
Admission: RE | Admit: 2016-03-11 | Discharge: 2016-03-11 | Disposition: A | Discharge status: Home / Self Care | Payer: BLUE CROSS/BLUE SHIELD | Source: Ambulatory Visit | Attending: Family Medicine | Admitting: Family Medicine

## 2016-03-11 VITALS — BP 100/70 | HR 79 | Temp 98.6°F | Ht 62.5 in | Wt 193.0 lb

## 2016-03-11 DIAGNOSIS — Z1151 Encounter for screening for human papillomavirus (HPV): Secondary | ICD-10-CM | POA: Insufficient documentation

## 2016-03-11 DIAGNOSIS — Z01419 Encounter for gynecological examination (general) (routine) without abnormal findings: Secondary | ICD-10-CM | POA: Diagnosis not present

## 2016-03-11 DIAGNOSIS — Z309 Encounter for contraceptive management, unspecified: Secondary | ICD-10-CM

## 2016-03-11 DIAGNOSIS — Z124 Encounter for screening for malignant neoplasm of cervix: Secondary | ICD-10-CM

## 2016-03-11 DIAGNOSIS — Z6834 Body mass index (BMI) 34.0-34.9, adult: Secondary | ICD-10-CM

## 2016-03-11 DIAGNOSIS — Z0000 Encounter for general adult medical examination without abnormal findings: Secondary | ICD-10-CM | POA: Diagnosis not present

## 2016-03-11 DIAGNOSIS — R69 Illness, unspecified: Secondary | ICD-10-CM

## 2016-03-11 NOTE — Patient Instructions (Signed)
BEFORE YOU LEAVE: -follow up: yearly and as needed -labs  Get your flu vaccine when it is available in the early fall  We recommend the following healthy lifestyle: 1) Small portions - eat off of salad plate instead of dinner plate 2) Eat a healthy clean diet with avoidance of (less then 1 serving per week) processed foods, sweetened drinks, white starches, red meat, fast foods and sweets and consisting of: * 5-9 servings per day of fresh or frozen fruits and vegetables (not corn or potatoes, not dried or canned) *nuts and seeds, beans *olives and olive oil *small portions of lean meats such as fish and white chicken  *small portions of whole grains 3)Get at least 150 minutes of sweaty aerobic exercise per week 4)reduce stress - counseling, meditation, relaxation to balance other aspects of your life  We have ordered labs or studies at this visit. It can take up to 1-2 weeks for results and processing. IF results require follow up or explanation, we will call you with instructions. Clinically stable results will be released to your [facility]. If you have not heard from [place] or cannot find your results in [facility] in 2 weeks please contact our office at [phone].  If you are not yet signed up for [facility], please consider signing up.

## 2016-03-11 NOTE — Progress Notes (Signed)
Here for CPE:  -Concerns and/or follow up today: none Mild hyperlipidemia. On depo for pregnancy prevention.did not tolerate ocps. Aware of risks.  -Diet: variety of foods, balance and well rounded, larger portion sizes  -Exercise: no regular exercise  -Taking folic acid, vitamin D or calcium: no  -Diabetes and Dyslipidemia Screening: prefers to do non-fasting today  -Hx of HTN: no  -Vaccines: UTD  -pap history: normal 09/2012 with hpv   -FDLMP: no periods, on depo  -sexual activity: yes, female partner, no new partners  -wants STI testing (Hep C if born [birth year]): no  -FH breast, colon or ovarian ca: see FH Last mammogram: reports does at [employer] at work q 2 years, last one 1.5 years Last colon cancer screening: n/a   -Alcohol, Tobacco, drug use: see social history  Review of Systems - no fevers, unintentional weight loss, vision loss, hearing loss, chest pain, sob, hemoptysis, melena, hematochezia, hematuria, genital discharge, changing or concerning skin lesions, bleeding, bruising, loc, thoughts of self harm or SI      Past Medical History:  Diagnosis Date  . 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 Migraines   . Streptococcal sore throat 05/31/2013         Past Surgical History:  Procedure Laterality Date  . CESAREAN SECTION  2008          Family History  Problem Relation Age of Onset  . Breast cancer      grandmother  . Hypertension Father   . Heart defect Mother     recently admitted to [facility] per patient-needs valve replacement    Social History        Social History  . Marital status: Single    Spouse name: N/A  . Number of children: N/A  . Years of education: N/A           Social History Main Topics    . Smoking status: Never Smoker   . Smokeless tobacco: Not on file   . Alcohol use Yes      Comment: rare    . Drug use: Unknown  . Sexual activity: Not on file         Other Topics Concern  . Not on file      Social History Narrative  . No narrative on file     Current Outpatient Prescriptions:  .  medroxyPROGESTERone (DEPO-PROVERA) 150 MG/ML injection, Inject 150 mg into the muscle every 3 (three) months., Disp: , Rfl:   EXAM:  There were no vitals filed for this visit.  GENERAL: vitals reviewed and listed below, alert, oriented, appears well hydrated and in no acute distress  HEENT: head atraumatic, PERRLA, normal appearance of eyes, ears, nose and mouth. moist mucus membranes.  NECK: supple, no masses or lymphadenopathy  LUNGS: clear to auscultation bilaterally, no rales, rhonchi or wheeze  CV: HRRR, no peripheral edema or cyanosis, normal pedal pulses  BREAST: normal appearance - no lesions or discharge, on palpation normal breast tissue without any suspicious masses  ABDOMEN: bowel sounds normal, soft, non tender to palpation, no masses, no rebound or guarding  GU: normal appearance of external genitalia - no lesions or masses, normal vaginal mucosa - no abnormal discharge, normal appearance of cervix - no lesions or abnormal discharge, no masses or tenderness on palpation of uterus and ovaries.  RECTAL: refused  SKIN: no rash or abnormal lesions  MS: normal gait, moves all extremities normally  NEURO: normal gait, speech and thought processing grossly intact, muscle tone grossly intact throughout  PSYCH: normal affect, pleasant and cooperative  ASSESSMENT AND PLAN:  Discussed the following assessment and plan:  Encounter for preventive health examination - Plan: Hemoglobin A1c, CBC (no diff), TSH, HDL cholesterol, Cholesterol, Total, CANCELED: Lipid Panel  Encounter for contraceptive management, unspecified encounter  BMI 34.0-34.9,adult  -Discussed and advised all [place] preventive services health task force level A and B recommendations for age, sex and risks.  -per current UpToDate review  today, depo can be continued until mid 50s and no FSH or BMD testing needed.  -Advised at least 150 minutes of exercise per week and a healthy diet with avoidance of (less then 1 serving per week) processed foods, white starches, red meat, fast foods and sweets and consisting of: * 5-9 servings of fresh fruits and vegetables (not corn or potatoes) *nuts and seeds, beans *olives and olive oil *lean meats such as fish and white chicken  *whole grains  -NONfasting labs, studies and vaccines per orders this encounter  No orders of the defined types were placed in this encounter.   Patient advised to return to clinic immediately if symptoms worsen or persist or new concerns.  There are no Patient Instructions on file for this visit.  No Follow-up on file.  [name] R., DO

## 2016-03-11 NOTE — Addendum Note (Signed)
Addended by: Johnella Moloney on: 03/11/2016 03:34 PM   Modules accepted: Orders

## 2016-03-11 NOTE — Progress Notes (Signed)
Pre visit review using our clinic review tool, if applicable. No additional management support is needed unless otherwise documented below in the visit note. 

## 2016-03-11 NOTE — Progress Notes (Addendum)
Duplicate note created by my friend EPIC.

## 2016-03-12 ENCOUNTER — Other Ambulatory Visit: Payer: BLUE CROSS/BLUE SHIELD

## 2016-03-12 LAB — CBC
HCT: 39.7 % (ref 36.0–46.0)
HEMOGLOBIN: 13.3 g/dL (ref 12.0–15.0)
MCHC: 33.4 g/dL (ref 30.0–36.0)
MCV: 90.7 fl (ref 78.0–100.0)
PLATELETS: 377 10*3/uL (ref 150.0–400.0)
RBC: 4.38 Mil/uL (ref 3.87–5.11)
RDW: 13.7 % (ref 11.5–15.5)
WBC: 9.9 10*3/uL (ref 4.0–10.5)

## 2016-03-12 LAB — HEMOGLOBIN A1C: Hgb A1c MFr Bld: 5.4 % (ref 4.6–6.5)

## 2016-03-12 LAB — TSH: TSH: 1.31 u[IU]/mL (ref 0.35–4.50)

## 2016-03-12 LAB — HDL CHOLESTEROL: HDL: 46.4 mg/dL (ref >39.00)

## 2016-03-12 LAB — CHOLESTEROL, TOTAL: CHOLESTEROL: 181 mg/dL (ref 0–200)

## 2016-03-15 LAB — CYTOLOGY - PAP

## 2016-04-29 ENCOUNTER — Ambulatory Visit (INDEPENDENT_AMBULATORY_CARE_PROVIDER_SITE_OTHER): Payer: BLUE CROSS/BLUE SHIELD | Admitting: *Deleted

## 2016-04-29 DIAGNOSIS — Z3042 Encounter for surveillance of injectable contraceptive: Secondary | ICD-10-CM

## 2016-04-29 MED ORDER — MEDROXYPROGESTERONE ACETATE 150 MG/ML IM SUSP
150.0000 mg | Freq: Once | INTRAMUSCULAR | Status: AC
  Administered 2016-04-29: 150 mg via INTRAMUSCULAR

## 2016-05-12 DIAGNOSIS — Z23 Encounter for immunization: Secondary | ICD-10-CM | POA: Diagnosis not present

## 2016-06-01 DIAGNOSIS — H40013 Open angle with borderline findings, low risk, bilateral: Secondary | ICD-10-CM | POA: Diagnosis not present

## 2016-07-30 ENCOUNTER — Ambulatory Visit: Payer: BLUE CROSS/BLUE SHIELD | Admitting: Emergency Medicine

## 2016-08-03 ENCOUNTER — Ambulatory Visit (INDEPENDENT_AMBULATORY_CARE_PROVIDER_SITE_OTHER): Payer: BLUE CROSS/BLUE SHIELD

## 2016-08-03 DIAGNOSIS — Z309 Encounter for contraceptive management, unspecified: Secondary | ICD-10-CM

## 2016-08-03 DIAGNOSIS — Z0000 Encounter for general adult medical examination without abnormal findings: Secondary | ICD-10-CM | POA: Diagnosis not present

## 2016-08-03 MED ORDER — MEDROXYPROGESTERONE ACETATE 150 MG/ML IM SUSP
150.0000 mg | Freq: Once | INTRAMUSCULAR | Status: AC
  Administered 2016-08-03: 150 mg via INTRAMUSCULAR

## 2016-08-03 NOTE — Progress Notes (Signed)
Patient is late for her Depo- provera Injection that was due 07/29/16. Obtained urine from the patient for a pregnancy test. Patient was given Depo-Provera at 10am by Harriett SineNancy CMA.

## 2016-08-04 LAB — POCT URINE PREGNANCY: Preg Test, Ur: NEGATIVE

## 2016-10-22 ENCOUNTER — Ambulatory Visit (INDEPENDENT_AMBULATORY_CARE_PROVIDER_SITE_OTHER): Payer: BLUE CROSS/BLUE SHIELD | Admitting: *Deleted

## 2016-10-22 DIAGNOSIS — Z3042 Encounter for surveillance of injectable contraceptive: Secondary | ICD-10-CM | POA: Diagnosis not present

## 2016-10-22 MED ORDER — MEDROXYPROGESTERONE ACETATE 150 MG/ML IM SUSP
150.0000 mg | Freq: Once | INTRAMUSCULAR | Status: AC
  Administered 2016-10-22: 150 mg via INTRAMUSCULAR

## 2017-01-14 ENCOUNTER — Ambulatory Visit (INDEPENDENT_AMBULATORY_CARE_PROVIDER_SITE_OTHER): Payer: BLUE CROSS/BLUE SHIELD | Admitting: *Deleted

## 2017-01-14 DIAGNOSIS — Z3042 Encounter for surveillance of injectable contraceptive: Secondary | ICD-10-CM

## 2017-01-14 MED ORDER — MEDROXYPROGESTERONE ACETATE 150 MG/ML IM SUSP
150.0000 mg | Freq: Once | INTRAMUSCULAR | Status: AC
  Administered 2017-01-14: 150 mg via INTRAMUSCULAR

## 2017-03-17 NOTE — Progress Notes (Signed)
HPI:  Here for CPE:  -Concerns and/or follow up today: none  -Diet: variety of foods, balance and well rounded, reports she has been eating better and has cut out soda, he'll love simple starches and has not been able to cut these out yet -Exercise: Walking 2 miles per day Continues on the Depo for pregnancy prevention and is doing well. She did not tolerate oral contraceptions and likes but discrete method of the Depakote injection. No history of fractures, smoking, family history of osteoporosis or other risk factors for osteoporosis. She is taking vitamin D daily and is getting weightbearing exercise.  -Taking folic acid, vitamin D or calcium: no  -Diabetes and Dyslipidemia Screening: Fasting for labs  -Hx of HTN: no  -Vaccines: UTD, she plans to get her flu shot later in season   -pap history: 03/2016 - neg, hpv neg  -FDLMP:Not applicable, on Depo  -sexual activity: yes, female partner, no new partners  -wants STI testing (Hep C if born [birth year]): no  -FH breast, colon or ovarian ca: see FH Last mammogram: 2014; she plans to schedule a mammogram this year, does self breast exams monthly and has no concerns, declines a clinical breast exam today  Last colon cancer screening:Not applicable     -Alcohol, Tobacco, drug use: see social history  Review of Systems - no fevers, unintentional weight loss, vision loss, hearing loss, chest pain, sob, hemoptysis, melena, hematochezia, hematuria, genital discharge, changing or concerning skin lesions, bleeding, bruising, loc, thoughts of self harm or SI  Past Medical History:  Diagnosis Date  . 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 Migraines   . Streptococcal sore throat 05/31/2013    Past Surgical History:  Procedure Laterality Date  . CESAREAN SECTION  2008    Family History  Problem Relation Age of Onset  . Breast cancer Unknown        grandmother  . Hypertension Father   . Heart defect Mother         recently admitted to [facility] per patient-needs valve replacement    Social History   Social History  . Marital status: Single    Spouse name: N/A  . Number of children: N/A  . Years of education: N/A   Social History Main Topics  . Smoking status: Never Smoker  . Smokeless tobacco: Never Used  . Alcohol use Yes     Comment: rare  . Drug use: Unknown  . Sexual activity: Not Asked   Other Topics Concern  . None   Social History Narrative  . None     Current Outpatient Prescriptions:  .  medroxyPROGESTERone (DEPO-PROVERA) 150 MG/ML injection, Inject 150 mg into the muscle every 3 (three) months., Disp: , Rfl:   EXAM:  Vitals:   03/18/17 0904  BP: 102/60  Pulse: 65  Temp: 99.1 F (37.3 C)    GENERAL: vitals reviewed and listed below, alert, oriented, appears well hydrated and in no acute distress  HEENT: head atraumatic, PERRLA, normal appearance of eyes, ears, nose and mouth. moist mucus membranes.  NECK: supple, no masses or lymphadenopathy  LUNGS: clear to auscultation bilaterally, no rales, rhonchi or wheeze  CV: HRRR, no peripheral edema or cyanosis, normal pedal pulses  ABDOMEN: bowel sounds normal, soft, non tender to palpation, no masses, no rebound or guarding  SKIN: no rash or abnormal lesions  MS: normal gait, moves all extremities normally  NEURO: normal gait, speech and thought processing  grossly intact, muscle tone grossly intact throughout  PSYCH: normal affect, pleasant and cooperative  ASSESSMENT AND PLAN:  Discussed the following assessment and plan:  Encounter for preventative adult health care examination - Plan: Hemoglobin A1c, Lipid panel  It is -Discussed and advised all [place] preventive services health task force level A and B recommendations for age, sex and risks.  -Advised at least 150 minutes of exercise per week and a healthy diet with avoidance of (less then 1 serving per week) processed foods, white starches, red  meat, fast foods and sweets and consisting of: * 5-9 servings of fresh fruits and vegetables (not corn or potatoes) *nuts and seeds, beans *olives and olive oil *lean meats such as fish and white chicken  *whole grains  -labs, studies and vaccines per orders this encounter  Orders Placed This Encounter  Procedures  . Hemoglobin A1c  . Lipid panel    Patient advised to return to clinic immediately if symptoms worsen or persist or new concerns.  Patient Instructions  BEFORE YOU LEAVE: -follow up: Yearly for your physical -Nurse visit for flu shot in October -Labs  We have ordered labs or studies at this visit. It can take up to 1-2 weeks for results and processing. IF results require follow up or explanation, we will call you with instructions. Clinically stable results will be released to your [facility]. If you have not heard from [place] or cannot find your results in [facility] in 2 weeks please contact our office at [phone].  If you are not yet signed up for [facility] please consider signing up.  Health Maintenance, Female Adopting a healthy lifestyle and getting preventive care can go a long way to promote health and wellness. Talk with your health care provider about what schedule of regular examinations is right for you. This is a good chance for you to check in with your provider about disease prevention and staying healthy. In between checkups, there are plenty of things you can do on your own. Experts have done a lot of research about which lifestyle changes and preventive measures are most likely to keep you healthy. Ask your health care provider for more information. Weight and diet Eat a healthy diet  Be sure to include plenty of vegetables, fruits, low-fat dairy products, and lean protein.  Do not eat a lot of foods high in solid fats, added sugars, or salt.  Get regular exercise. This is one of the most important things you can do for your health. ? Most adults should  exercise for at least 150 minutes each week. The exercise should increase your heart rate and make you sweat (moderate-intensity exercise). ? Most adults should also do strengthening exercises at least twice a week. This is in addition to the moderate-intensity exercise.  Maintain a healthy weight  Body mass index (BMI) is a measurement that can be used to identify possible weight problems. It estimates body fat based on height and weight. Your health care provider can help determine your BMI and help you achieve or maintain a healthy weight.  For females 22 years of age and older: ? A BMI below 18.5 is considered underweight. ? A BMI of 18.5 to 24.9 is normal. ? A BMI of 25 to 29.9 is considered overweight. ? A BMI of 30 and above is considered obese.  Watch levels of cholesterol and blood lipids  You should start having your blood tested for lipids and cholesterol at 48 years of age, then have this test  every 5 years.  You may need to have your cholesterol levels checked more often if: ? Your lipid or cholesterol levels are high. ? You are older than 48 years of age. ? You are at high risk for heart disease.  Cancer screening Lung Cancer  Lung cancer screening is recommended for adults 85-40 years old who are at high risk for lung cancer because of a history of smoking.  A yearly low-dose CT scan of the lungs is recommended for people who: ? Currently smoke. ? Have quit within the past 15 years. ? Have at least a 30-pack-year history of smoking. A pack year is smoking an average of one pack of cigarettes a day for 1 year.  Yearly screening should continue until it has been 15 years since you quit.  Yearly screening should stop if you develop a health problem that would prevent you from having lung cancer treatment.  Breast Cancer  Practice breast self-awareness. This means understanding how your breasts normally appear and feel.  It also means doing regular breast  self-exams. Let your health care provider know about any changes, no matter how small.  If you are in your 20s or 30s, you should have a clinical breast exam (CBE) by a health care provider every 1-3 years as part of a regular health exam.  If you are 12 or older, have a CBE every year. Also consider having a breast X-ray (mammogram) every year.  If you have a family history of breast cancer, talk to your health care provider about genetic screening.  If you are at high risk for breast cancer, talk to your health care provider about having an MRI and a mammogram every year.  Breast cancer gene (BRCA) assessment is recommended for women who have family members with BRCA-related cancers. BRCA-related cancers include: ? Breast. ? Ovarian. ? Tubal. ? Peritoneal cancers.  Results of the assessment will determine the need for genetic counseling and BRCA1 and BRCA2 testing.  Cervical Cancer Your health care provider may recommend that you be screened regularly for cancer of the pelvic organs (ovaries, uterus, and vagina). This screening involves a pelvic examination, including checking for microscopic changes to the surface of your cervix (Pap test). You may be encouraged to have this screening done every 3 years, beginning at age 23.  For women ages 37-65, health care providers may recommend pelvic exams and Pap testing every 3 years, or they may recommend the Pap and pelvic exam, combined with testing for human papilloma virus (HPV), every 5 years. Some types of HPV increase your risk of cervical cancer. Testing for HPV may also be done on women of any age with unclear Pap test results.  Other health care providers may not recommend any screening for nonpregnant women who are considered low risk for pelvic cancer and who do not have symptoms. Ask your health care provider if a screening pelvic exam is right for you.  If you have had past treatment for cervical cancer or a condition that could  lead to cancer, you need Pap tests and screening for cancer for at least 20 years after your treatment. If Pap tests have been discontinued, your risk factors (such as having a new sexual partner) need to be reassessed to determine if screening should resume. Some women have medical problems that increase the chance of getting cervical cancer. In these cases, your health care provider may recommend more frequent screening and Pap tests.  Colorectal Cancer  This type of  cancer can be detected and often prevented.  Routine colorectal cancer screening usually begins at 48 years of age and continues through 48 years of age.  Your health care provider may recommend screening at an earlier age if you have risk factors for colon cancer.  Your health care provider may also recommend using home test kits to check for hidden blood in the stool.  A small camera at the end of a tube can be used to examine your colon directly (sigmoidoscopy or colonoscopy). This is done to check for the earliest forms of colorectal cancer.  Routine screening usually begins at age 60.  Direct examination of the colon should be repeated every 5-10 years through 48 years of age. However, you may need to be screened more often if early forms of precancerous polyps or small growths are found.  Skin Cancer  Check your skin from head to toe regularly.  Tell your health care provider about any new moles or changes in moles, especially if there is a change in a mole's shape or color.  Also tell your health care provider if you have a mole that is larger than the size of a pencil eraser.  Always use sunscreen. Apply sunscreen liberally and repeatedly throughout the day.  Protect yourself by wearing long sleeves, pants, a wide-brimmed hat, and sunglasses whenever you are outside.  Heart disease, diabetes, and high blood pressure  High blood pressure causes heart disease and increases the risk of stroke. High blood pressure  is more likely to develop in: ? People who have blood pressure in the high end of the normal range (130-139/85-89 mm Hg). ? People who are overweight or obese. ? People who are African American.  If you are 27-60 years of age, have your blood pressure checked every 3-5 years. If you are 10 years of age or older, have your blood pressure checked every year. You should have your blood pressure measured twice-once when you are at a hospital or clinic, and once when you are not at a hospital or clinic. Record the average of the two measurements. To check your blood pressure when you are not at a hospital or clinic, you can use: ? An automated blood pressure machine at a pharmacy. ? A home blood pressure monitor.  If you are between 19 years and 1 years old, ask your health care provider if you should take aspirin to prevent strokes.  Have regular diabetes screenings. This involves taking a blood sample to check your fasting blood sugar level. ? If you are at a normal weight and have a low risk for diabetes, have this test once every three years after 48 years of age. ? If you are overweight and have a high risk for diabetes, consider being tested at a younger age or more often. Preventing infection Hepatitis B  If you have a higher risk for hepatitis B, you should be screened for this virus. You are considered at high risk for hepatitis B if: ? You were born in a country where hepatitis B is common. Ask your health care provider which countries are considered high risk. ? Your parents were born in a high-risk country, and you have not been immunized against hepatitis B (hepatitis B vaccine). ? You have HIV or AIDS. ? You use needles to inject street drugs. ? You live with someone who has hepatitis B. ? You have had sex with someone who has hepatitis B. ? You get hemodialysis treatment. ? You take  certain medicines for conditions, including cancer, organ transplantation, and autoimmune  conditions.  Hepatitis C  Blood testing is recommended for: ? Everyone born from [birth year] through 1967. ? Anyone with known risk factors for hepatitis C.  Sexually transmitted infections (STIs)  You should be screened for sexually transmitted infections (STIs) including gonorrhea and chlamydia if: ? You are sexually active and are younger than 48 years of age. ? You are older than 48 years of age and your health care provider tells you that you are at risk for this type of infection. ? Your sexual activity has changed since you were last screened and you are at an increased risk for chlamydia or gonorrhea. Ask your health care provider if you are at risk.  If you do not have HIV, but are at risk, it may be recommended that you take a prescription medicine daily to prevent HIV infection. This is called pre-exposure prophylaxis (PrEP). You are considered at risk if: ? You are sexually active and do not regularly use condoms or know the HIV status of your partner(s). ? You take drugs by injection. ? You are sexually active with a partner who has HIV.  Talk with your health care provider about whether you are at high risk of being infected with HIV. If you choose to begin PrEP, you should first be tested for HIV. You should then be tested every 3 months for as long as you are taking PrEP. Pregnancy  If you are premenopausal and you may become pregnant, ask your health care provider about preconception counseling.  If you may become pregnant, take 400 to 800 micrograms (mcg) of folic acid every day.  If you want to prevent pregnancy, talk to your health care provider about birth control (contraception). Osteoporosis and menopause  Osteoporosis is a disease in which the bones lose minerals and strength with aging. This can result in serious bone fractures. Your risk for osteoporosis can be identified using a bone density scan.  If you are 45 years of age or older, or if you are at risk for  osteoporosis and fractures, ask your health care provider if you should be screened.  Ask your health care provider whether you should take a calcium or vitamin D supplement to lower your risk for osteoporosis.  Menopause may have certain physical symptoms and risks.  Hormone replacement therapy may reduce some of these symptoms and risks. Talk to your health care provider about whether hormone replacement therapy is right for you. Follow these instructions at home:  Schedule regular health, dental, and eye exams.  Stay current with your immunizations.  Do not use any tobacco products including cigarettes, chewing tobacco, or electronic cigarettes.  If you are pregnant, do not drink alcohol.  If you are breastfeeding, limit how much and how often you drink alcohol.  Limit alcohol intake to no more than 1 drink per day for nonpregnant women. One drink equals 12 ounces of beer, 5 ounces of wine, or 1 ounces of hard liquor.  Do not use street drugs.  Do not share needles.  Ask your health care provider for help if you need support or information about quitting drugs.  Tell your health care provider if you often feel depressed.  Tell your health care provider if you have ever been abused or do not feel safe at home. This information is not intended to replace advice given to you by your health care provider. Make sure you discuss any questions you have with  your health care provider. Document Released: 02/08/2011 Document Revised: 01/01/2016 Document Reviewed: 04/29/2015 Elsevier Interactive Patient Education  2018 [employer].          No Follow-up on file.  Colin Benton R., DO

## 2017-03-18 ENCOUNTER — Encounter: Payer: Self-pay | Admitting: Family Medicine

## 2017-03-18 ENCOUNTER — Ambulatory Visit (INDEPENDENT_AMBULATORY_CARE_PROVIDER_SITE_OTHER): Payer: BLUE CROSS/BLUE SHIELD | Admitting: Family Medicine

## 2017-03-18 VITALS — BP 102/60 | HR 65 | Temp 99.1°F | Ht 63.0 in | Wt 186.8 lb

## 2017-03-18 DIAGNOSIS — Z0000 Encounter for general adult medical examination without abnormal findings: Secondary | ICD-10-CM

## 2017-03-18 LAB — LIPID PANEL
Cholesterol: 167 mg/dL (ref 0–200)
HDL: 49.8 mg/dL (ref >39.00)
LDL Cholesterol: 100 mg/dL — ABNORMAL HIGH (ref 0–99)
NONHDL: 117.42
Total CHOL/HDL Ratio: 3
Triglycerides: 86 mg/dL (ref 0.0–149.0)
VLDL: 17.2 mg/dL (ref 0.0–40.0)

## 2017-03-18 LAB — HEMOGLOBIN A1C: HEMOGLOBIN A1C: 5.4 % (ref 4.6–6.5)

## 2017-03-18 NOTE — Patient Instructions (Signed)
BEFORE YOU LEAVE: -follow up: Yearly for your physical -Nurse visit for flu shot in October -Labs  We have ordered labs or studies at this visit. It can take up to 1-2 weeks for results and processing. IF results require follow up or explanation, we will call you with instructions. Clinically stable results will be released to your [facility]. If you have not heard from [place] or cannot find your results in [facility] in 2 weeks please contact our office at [phone].  If you are not yet signed up for [facility], please consider signing up.  Health Maintenance, Female Adopting a healthy lifestyle and getting preventive care can go a long way to promote health and wellness. Talk with your health care provider about what schedule of regular examinations is right for you. This is a good chance for you to check in with your provider about disease prevention and staying healthy. In between checkups, there are plenty of things you can do on your own. Experts have done a lot of research about which lifestyle changes and preventive measures are most likely to keep you healthy. Ask your health care provider for more information. Weight and diet Eat a healthy diet  Be sure to include plenty of vegetables, fruits, low-fat dairy products, and lean protein.  Do not eat a lot of foods high in solid fats, added sugars, or salt.  Get regular exercise. This is one of the most important things you can do for your health. ? Most adults should exercise for at least 150 minutes each week. The exercise should increase your heart rate and make you sweat (moderate-intensity exercise). ? Most adults should also do strengthening exercises at least twice a week. This is in addition to the moderate-intensity exercise.  Maintain a healthy weight  Body mass index (BMI) is a measurement that can be used to identify possible weight problems. It estimates body fat based on height and weight. Your health care provider can help  determine your BMI and help you achieve or maintain a healthy weight.  For females 13 years of age and older: ? A BMI below 18.5 is considered underweight. ? A BMI of 18.5 to 24.9 is normal. ? A BMI of 25 to 29.9 is considered overweight. ? A BMI of 30 and above is considered obese.  Watch levels of cholesterol and blood lipids  You should start having your blood tested for lipids and cholesterol at 48 years of age, then have this test every 5 years.  You may need to have your cholesterol levels checked more often if: ? Your lipid or cholesterol levels are high. ? You are older than 48 years of age. ? You are at high risk for heart disease.  Cancer screening Lung Cancer  Lung cancer screening is recommended for adults 87-80 years old who are at high risk for lung cancer because of a history of smoking.  A yearly low-dose CT scan of the lungs is recommended for people who: ? Currently smoke. ? Have quit within the past 15 years. ? Have at least a 30-pack-year history of smoking. A pack year is smoking an average of one pack of cigarettes a day for 1 year.  Yearly screening should continue until it has been 15 years since you quit.  Yearly screening should stop if you develop a health problem that would prevent you from having lung cancer treatment.  Breast Cancer  Practice breast self-awareness. This means understanding how your breasts normally appear and feel.  It  also means doing regular breast self-exams. Let your health care provider know about any changes, no matter how small.  If you are in your 20s or 30s, you should have a clinical breast exam (CBE) by a health care provider every 1-3 years as part of a regular health exam.  If you are 46 or older, have a CBE every year. Also consider having a breast X-ray (mammogram) every year.  If you have a family history of breast cancer, talk to your health care provider about genetic screening.  If you are at high risk for  breast cancer, talk to your health care provider about having an MRI and a mammogram every year.  Breast cancer gene (BRCA) assessment is recommended for women who have family members with BRCA-related cancers. BRCA-related cancers include: ? Breast. ? Ovarian. ? Tubal. ? Peritoneal cancers.  Results of the assessment will determine the need for genetic counseling and BRCA1 and BRCA2 testing.  Cervical Cancer Your health care provider may recommend that you be screened regularly for cancer of the pelvic organs (ovaries, uterus, and vagina). This screening involves a pelvic examination, including checking for microscopic changes to the surface of your cervix (Pap test). You may be encouraged to have this screening done every 3 years, beginning at age 3.  For women ages 33-65, health care providers may recommend pelvic exams and Pap testing every 3 years, or they may recommend the Pap and pelvic exam, combined with testing for human papilloma virus (HPV), every 5 years. Some types of HPV increase your risk of cervical cancer. Testing for HPV may also be done on women of any age with unclear Pap test results.  Other health care providers may not recommend any screening for nonpregnant women who are considered low risk for pelvic cancer and who do not have symptoms. Ask your health care provider if a screening pelvic exam is right for you.  If you have had past treatment for cervical cancer or a condition that could lead to cancer, you need Pap tests and screening for cancer for at least 20 years after your treatment. If Pap tests have been discontinued, your risk factors (such as having a new sexual partner) need to be reassessed to determine if screening should resume. Some women have medical problems that increase the chance of getting cervical cancer. In these cases, your health care provider may recommend more frequent screening and Pap tests.  Colorectal Cancer  This type of cancer can be  detected and often prevented.  Routine colorectal cancer screening usually begins at 47 years of age and continues through 48 years of age.  Your health care provider may recommend screening at an earlier age if you have risk factors for colon cancer.  Your health care provider may also recommend using home test kits to check for hidden blood in the stool.  A small camera at the end of a tube can be used to examine your colon directly (sigmoidoscopy or colonoscopy). This is done to check for the earliest forms of colorectal cancer.  Routine screening usually begins at age 4.  Direct examination of the colon should be repeated every 5-10 years through 48 years of age. However, you may need to be screened more often if early forms of precancerous polyps or small growths are found.  Skin Cancer  Check your skin from head to toe regularly.  Tell your health care provider about any new moles or changes in moles, especially if there is a change  in a mole's shape or color.  Also tell your health care provider if you have a mole that is larger than the size of a pencil eraser.  Always use sunscreen. Apply sunscreen liberally and repeatedly throughout the day.  Protect yourself by wearing long sleeves, pants, a wide-brimmed hat, and sunglasses whenever you are outside.  Heart disease, diabetes, and high blood pressure  High blood pressure causes heart disease and increases the risk of stroke. High blood pressure is more likely to develop in: ? People who have blood pressure in the high end of the normal range (130-139/85-89 mm Hg). ? People who are overweight or obese. ? People who are African American.  If you are 85-39 years of age, have your blood pressure checked every 3-5 years. If you are 54 years of age or older, have your blood pressure checked every year. You should have your blood pressure measured twice-once when you are at a hospital or clinic, and once when you are not at a  hospital or clinic. Record the average of the two measurements. To check your blood pressure when you are not at a hospital or clinic, you can use: ? An automated blood pressure machine at a pharmacy. ? A home blood pressure monitor.  If you are between 69 years and 50 years old, ask your health care provider if you should take aspirin to prevent strokes.  Have regular diabetes screenings. This involves taking a blood sample to check your fasting blood sugar level. ? If you are at a normal weight and have a low risk for diabetes, have this test once every three years after 48 years of age. ? If you are overweight and have a high risk for diabetes, consider being tested at a younger age or more often. Preventing infection Hepatitis B  If you have a higher risk for hepatitis B, you should be screened for this virus. You are considered at high risk for hepatitis B if: ? You were born in a country where hepatitis B is common. Ask your health care provider which countries are considered high risk. ? Your parents were born in a high-risk country, and you have not been immunized against hepatitis B (hepatitis B vaccine). ? You have HIV or AIDS. ? You use needles to inject street drugs. ? You live with someone who has hepatitis B. ? You have had sex with someone who has hepatitis B. ? You get hemodialysis treatment. ? You take certain medicines for conditions, including cancer, organ transplantation, and autoimmune conditions.  Hepatitis C  Blood testing is recommended for: ? Everyone born from [birth year] through 1967. ? Anyone with known risk factors for hepatitis C.  Sexually transmitted infections (STIs)  You should be screened for sexually transmitted infections (STIs) including gonorrhea and chlamydia if: ? You are sexually active and are younger than 48 years of age. ? You are older than 48 years of age and your health care provider tells you that you are at risk for this type of  infection. ? Your sexual activity has changed since you were last screened and you are at an increased risk for chlamydia or gonorrhea. Ask your health care provider if you are at risk.  If you do not have HIV, but are at risk, it may be recommended that you take a prescription medicine daily to prevent HIV infection. This is called pre-exposure prophylaxis (PrEP). You are considered at risk if: ? You are sexually active and do not regularly use  condoms or know the HIV status of your partner(s). ? You take drugs by injection. ? You are sexually active with a partner who has HIV.  Talk with your health care provider about whether you are at high risk of being infected with HIV. If you choose to begin PrEP, you should first be tested for HIV. You should then be tested every 3 months for as long as you are taking PrEP. Pregnancy  If you are premenopausal and you may become pregnant, ask your health care provider about preconception counseling.  If you may become pregnant, take 400 to 800 micrograms (mcg) of folic acid every day.  If you want to prevent pregnancy, talk to your health care provider about birth control (contraception). Osteoporosis and menopause  Osteoporosis is a disease in which the bones lose minerals and strength with aging. This can result in serious bone fractures. Your risk for osteoporosis can be identified using a bone density scan.  If you are 61 years of age or older, or if you are at risk for osteoporosis and fractures, ask your health care provider if you should be screened.  Ask your health care provider whether you should take a calcium or vitamin D supplement to lower your risk for osteoporosis.  Menopause may have certain physical symptoms and risks.  Hormone replacement therapy may reduce some of these symptoms and risks. Talk to your health care provider about whether hormone replacement therapy is right for you. Follow these instructions at home:  Schedule  regular health, dental, and eye exams.  Stay current with your immunizations.  Do not use any tobacco products including cigarettes, chewing tobacco, or electronic cigarettes.  If you are pregnant, do not drink alcohol.  If you are breastfeeding, limit how much and how often you drink alcohol.  Limit alcohol intake to no more than 1 drink per day for nonpregnant women. One drink equals 12 ounces of beer, 5 ounces of wine, or 1 ounces of hard liquor.  Do not use street drugs.  Do not share needles.  Ask your health care provider for help if you need support or information about quitting drugs.  Tell your health care provider if you often feel depressed.  Tell your health care provider if you have ever been abused or do not feel safe at home. This information is not intended to replace advice given to you by your health care provider. Make sure you discuss any questions you have with your health care provider. Document Released: 02/08/2011 Document Revised: 01/01/2016 Document Reviewed: 04/29/2015 Elsevier Interactive Patient Education  [employer].

## 2017-04-07 ENCOUNTER — Ambulatory Visit (INDEPENDENT_AMBULATORY_CARE_PROVIDER_SITE_OTHER): Payer: BLUE CROSS/BLUE SHIELD

## 2017-04-07 DIAGNOSIS — Z309 Encounter for contraceptive management, unspecified: Secondary | ICD-10-CM | POA: Diagnosis not present

## 2017-04-07 MED ORDER — MEDROXYPROGESTERONE ACETATE 150 MG/ML IM SUSP
150.0000 mg | Freq: Once | INTRAMUSCULAR | Status: AC
  Administered 2017-04-07: 150 mg via INTRAMUSCULAR

## 2017-04-07 NOTE — Progress Notes (Signed)
Pt had her Depo-provera injection today, patient was on time with her contraceptiveappointment schedule, Pt was provided with a return schedule for her next  Given by Mendel CorningNancy Aniela Caniglia CMA.

## 2017-04-28 ENCOUNTER — Encounter: Payer: Self-pay | Admitting: Family Medicine

## 2017-05-12 DIAGNOSIS — Z23 Encounter for immunization: Secondary | ICD-10-CM | POA: Diagnosis not present

## 2017-06-15 DIAGNOSIS — H40013 Open angle with borderline findings, low risk, bilateral: Secondary | ICD-10-CM | POA: Diagnosis not present

## 2017-06-15 DIAGNOSIS — D3121 Benign neoplasm of right retina: Secondary | ICD-10-CM | POA: Diagnosis not present

## 2017-06-15 DIAGNOSIS — H524 Presbyopia: Secondary | ICD-10-CM | POA: Diagnosis not present

## 2017-06-27 ENCOUNTER — Ambulatory Visit (INDEPENDENT_AMBULATORY_CARE_PROVIDER_SITE_OTHER): Payer: BLUE CROSS/BLUE SHIELD | Admitting: *Deleted

## 2017-06-27 DIAGNOSIS — Z308 Encounter for other contraceptive management: Secondary | ICD-10-CM | POA: Diagnosis not present

## 2017-06-27 MED ORDER — MEDROXYPROGESTERONE ACETATE 150 MG/ML IM SUSP
150.0000 mg | Freq: Once | INTRAMUSCULAR | Status: AC
  Administered 2017-06-27: 150 mg via INTRAMUSCULAR

## 2017-06-27 NOTE — Progress Notes (Signed)
Pt here for her Depo-Provera injection today. Pt is within her 3 month window.  Appt scheduled for next injection by Nadara EatonAshtyn Alta Shober, CMA.

## 2017-08-18 ENCOUNTER — Encounter: Payer: Self-pay | Admitting: Family Medicine

## 2017-09-16 ENCOUNTER — Ambulatory Visit (INDEPENDENT_AMBULATORY_CARE_PROVIDER_SITE_OTHER): Payer: BLUE CROSS/BLUE SHIELD

## 2017-09-16 DIAGNOSIS — Z308 Encounter for other contraceptive management: Secondary | ICD-10-CM | POA: Diagnosis not present

## 2017-09-16 DIAGNOSIS — Z30019 Encounter for initial prescription of contraceptives, unspecified: Secondary | ICD-10-CM

## 2017-09-16 MED ORDER — MEDROXYPROGESTERONE ACETATE 150 MG/ML IM SUSP
150.0000 mg | Freq: Once | INTRAMUSCULAR | Status: AC
  Administered 2017-09-16: 150 mg via INTRAMUSCULAR

## 2017-09-16 NOTE — Patient Instructions (Signed)
Patient received her Depo-Provera this morning, pt was on schedule with her injection, pt tolerated injection with no problems.

## 2017-12-09 ENCOUNTER — Ambulatory Visit (INDEPENDENT_AMBULATORY_CARE_PROVIDER_SITE_OTHER): Payer: BLUE CROSS/BLUE SHIELD | Admitting: *Deleted

## 2017-12-09 DIAGNOSIS — Z3042 Encounter for surveillance of injectable contraceptive: Secondary | ICD-10-CM

## 2017-12-09 MED ORDER — MEDROXYPROGESTERONE ACETATE 150 MG/ML IM SUSP
150.0000 mg | Freq: Once | INTRAMUSCULAR | Status: AC
  Administered 2017-12-09: 150 mg via INTRAMUSCULAR

## 2017-12-09 NOTE — Progress Notes (Signed)
Per orders of Dr. Caryl Never, injection of Medroxyprogesterone acetate injection given by Johnella Moloney. Patient tolerated injection well.

## 2018-03-01 ENCOUNTER — Ambulatory Visit (INDEPENDENT_AMBULATORY_CARE_PROVIDER_SITE_OTHER): Payer: BLUE CROSS/BLUE SHIELD | Admitting: Family Medicine

## 2018-03-01 ENCOUNTER — Telehealth: Payer: Self-pay | Admitting: Family Medicine

## 2018-03-01 DIAGNOSIS — Z309 Encounter for contraceptive management, unspecified: Secondary | ICD-10-CM

## 2018-03-01 MED ORDER — MEDROXYPROGESTERONE ACETATE 150 MG/ML IM SUSP
150.0000 mg | Freq: Once | INTRAMUSCULAR | Status: AC
  Administered 2018-03-01: 150 mg via INTRAMUSCULAR

## 2018-03-01 NOTE — Telephone Encounter (Signed)
Pt was here today for Depo Provera injection, she has been spotting a few times a week now for about 2 weeks, wanted to know if this is normal due to her age?

## 2018-03-01 NOTE — Progress Notes (Signed)
Per orders of Dr. Salomon FickBanks, injection of Depo Provera 150 mg given by Aniceto BossNIMMONS, SYLVIA ANN. Patient tolerated injection well.   Dr. Selena BattenKim is out of the office today.

## 2018-03-02 NOTE — Telephone Encounter (Signed)
Recommend seeing gynecology. This could be many things and need to see gyn. Please place referral if she needs one or give her number for offices to call. appt if questions.

## 2018-03-02 NOTE — Telephone Encounter (Signed)
I called the pt and informed her of the message below.  Patient was given phone numbers for OB/GYN offices from our pamphlet and stated she will call back if needed.

## 2018-03-22 NOTE — Progress Notes (Signed)
HPI:  Using dictation device. Unfortunately this device frequently misinterprets words/phrases.  Here for CPE:  Due for labs, mammo, testanus -Concerns and/or follow up today:  Chronic medical problems summarized below were reviewed for changes. Uses depo for contraception as prefers a discrete method and likes the convenience/does not wish to use estrogen. Reviewed current data per UpToDate and she wants to continue to age 47. She has a new concern of stress and wonders if she needs a "chill pill". Has increased stress at work and children going back to college. Worries about all of this at times. Currently no depression, panic, SI, emotional lability, hot flashes, manic symptoms or significant impact on function.  -Diet: variety of foods, balance and well rounded -Exercise: some exercise with walking and puppy -Taking folic acid, vitamin D or calcium: no -Diabetes and Dyslipidemia Screening:fasting for labs -Vaccines: see vaccine section EPIC -pap history: 03/2016 -FDLMP: see nursing notes -FH breast, colon or ovarian ca: see FH Last mammogram: last in epic 05/2013 Last colon cancer screening:n/a due next year, does breast exam on herself frequently Breast Ca Risk Assessment: see family history and pt history DEXA (>/= 65): n/a  -Alcohol, Tobacco, drug use: see social history  Review of Systems - no fevers, unintentional weight loss, vision loss, hearing loss, chest pain, sob, hemoptysis, melena, hematochezia, hematuria, genital discharge, changing or concerning skin lesions, bleeding, bruising, loc, thoughts of self harm or SI  Past Medical History:  Diagnosis Date  . 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 Migraines   . Streptococcal sore throat 05/31/2013    Past Surgical History:  Procedure Laterality Date  . CESAREAN SECTION  2008    Family History  Problem Relation Age of Onset  . Breast cancer Unknown        grandmother  . Hypertension  Father   . Heart defect Mother        recently admitted to [facility] per patient-needs valve replacement    Social History   Socioeconomic History  . Marital status: Single    Spouse name: Not on file  . Number of children: Not on file  . Years of education: Not on file  . Highest education level: Not on file  Occupational History  . Not on file  Social Needs  . Financial resource strain: Not on file  . Food insecurity:    Worry: Not on file    Inability: Not on file  . Transportation needs:    Medical: Not on file    Non-medical: Not on file  Tobacco Use  . Smoking status: Never Smoker  . Smokeless tobacco: Never Used  Substance and Sexual Activity  . Alcohol use: Yes    Comment: rare  . Drug use: Not on file  . Sexual activity: Not on file  Lifestyle  . Physical activity:    Days per week: Not on file    Minutes per session: Not on file  . Stress: Not on file  Relationships  . Social connections:    Talks on phone: Not on file    Gets together: Not on file    Attends religious service: Not on file    Active member of club or organization: Not on file    Attends meetings of clubs or organizations: Not on file    Relationship status: Not on file  Other Topics Concern  . Not on file  Social History Narrative  . Not on file  Current Outpatient Medications:  .  medroxyPROGESTERone (DEPO-PROVERA) 150 MG/ML injection, Inject 150 mg into the muscle every 3 (three) months., Disp: , Rfl:   EXAM:  Vitals:   03/23/18 0707  BP: 100/78  Pulse: 75  Temp: 98.8 F (37.1 C)  Body mass index is 32.83 kg/m.  GENERAL: vitals reviewed and listed below, alert, oriented, appears well hydrated and in no acute distress  HEENT: head atraumatic, PERRLA, normal appearance of eyes, ears, nose and mouth. moist mucus membranes.  NECK: supple, no masses or lymphadenopathy  LUNGS: clear to auscultation bilaterally, no rales, rhonchi or wheeze  CV: HRRR, no peripheral  edema or cyanosis, normal pedal pulses  ABDOMEN: bowel sounds normal, soft, non tender to palpation, no masses, no rebound or guarding  BREAST: normal appearance - no skin lesions or discharge noted on inspection of both breasts, on palpation of both breast and axillary region no suspicious lesions appreciated today  GU: declined  RECTAL: deferred  SKIN: no rash or abnormal lesions  MS: normal gait, moves all extremities normally  NEURO: normal gait, speech and thought processing grossly intact, muscle tone grossly intact throughout  PSYCH: normal affect, pleasant and cooperative  ASSESSMENT AND PLAN:  Discussed the following assessment and plan:  PREVENTIVE EXAM: -Discussed and advised all [place] preventive services health task force level A and B recommendations for age, sex and risks. -Advised at least 150 minutes of exercise per week and a healthy diet with avoidance of (less then 1 serving per week) processed foods, white starches, red meat, fast foods and sweets and consisting of: * 5-9 servings of fresh fruits and vegetables (not corn or potatoes) *nuts and seeds, beans *olives and olive oil *lean meats such as fish and white chicken  *whole grains -labs, studies and vaccines per orders this encounter  2. Screening for depression -see PHQ9  3. Stress -had lengthy discussion regarding the dx/sig GAD vs depression vs stress and treatment options -opted for CBT - brochure provided to call and she also may consider at work -follow up 3-4 months, consider ssri/snri if worsening or not improving  4. Contraception -discussed current recommendation per UTD -she opts to continue until age 54  5. Obesity: -lifestyle recs advise -depo may be contributing as well - pt prefers this method for discrete/simpler option for pregnancy prevention  Patient advised to return to clinic immediately if symptoms worsen or persist or new concerns.  Patient Instructions  BEFORE YOU  LEAVE: -tetanus booster -labs -follow up: 3-4 months for stress, yearly for physical  Call the breast center today to schedule your mammogram for breast cancer screening.  Get flu shot at work or here in October.  Call to schedule counseling to help with stress. Please follow up sooner if worsening or other concerns.   We have ordered labs or studies at this visit. It can take up to 1-2 weeks for results and processing. IF results require follow up or explanation, we will call you with instructions. Clinically stable results will be released to your [facility]. If you have not heard from [place] or cannot find your results in [facility] in 2 weeks please contact our office at [phone].  If you are not yet signed up for [facility], please consider signing up.   Preventive Care 40-64 Years, Female Preventive care refers to lifestyle choices and visits with your health care provider that can promote health and wellness. What does preventive care include?  A yearly physical exam. This is also called an annual  well check.  Dental exams once or twice a year.  Routine eye exams. Ask your health care provider how often you should have your eyes checked.  Personal lifestyle choices, including: ? Daily care of your teeth and gums. ? Regular physical activity. ? Eating a healthy diet. ? Avoiding tobacco and drug use. ? Limiting alcohol use. ? Practicing safe sex. ? Taking vitamin and mineral supplements as recommended by your health care provider. What happens during an annual well check? The services and screenings done by your health care provider during your annual well check will depend on your age, overall health, lifestyle risk factors, and family history of disease. Counseling Your health care provider may ask you questions about your:  Alcohol use.  Tobacco use.  Drug use.  Emotional well-being.  Home and relationship well-being.  Sexual activity.  Eating habits.  Work and  work Statistician.  Method of birth control.  Menstrual cycle.  Pregnancy history.  Screening You may have the following tests or measurements:  Height, weight, and BMI.  Blood pressure.  Lipid and cholesterol levels. These may be checked every 5 years, or more frequently if you are over 55 years old.  Skin check.  Lung cancer screening. You may have this screening every year starting at age 45 if you have a 30-pack-year history of smoking and currently smoke or have quit within the past 15 years.  Fecal occult blood test (FOBT) of the stool. You may have this test every year starting at age 86.  Flexible sigmoidoscopy or colonoscopy. You may have a sigmoidoscopy every 5 years or a colonoscopy every 10 years starting at age 76.  Hepatitis C blood test.  Hepatitis B blood test.  Sexually transmitted disease (STD) testing.  Diabetes screening. This is done by checking your blood sugar (glucose) after you have not eaten for a while (fasting). You may have this done every 1-3 years.  Mammogram. This may be done every 1-2 years. Talk to your health care provider about when you should start having regular mammograms. This may depend on whether you have a family history of breast cancer.  BRCA-related cancer screening. This may be done if you have a family history of breast, ovarian, tubal, or peritoneal cancers.  Pelvic exam and Pap test. This may be done every 3 years starting at age 70. Starting at age 65, this may be done every 5 years if you have a Pap test in combination with an HPV test.  Bone density scan. This is done to screen for osteoporosis. You may have this scan if you are at high risk for osteoporosis.  Discuss your test results, treatment options, and if necessary, the need for more tests with your health care provider. Vaccines Your health care provider may recommend certain vaccines, such as:  Influenza vaccine. This is recommended every year.  Tetanus,  diphtheria, and acellular pertussis (Tdap, Td) vaccine. You may need a Td booster every 10 years.  Varicella vaccine. You may need this if you have not been vaccinated.  Zoster vaccine. You may need this after age 39.  Measles, mumps, and rubella (MMR) vaccine. You may need at least one dose of MMR if you were born in 1957 or later. You may also need a second dose.  Pneumococcal 13-valent conjugate (PCV13) vaccine. You may need this if you have certain conditions and were not previously vaccinated.  Pneumococcal polysaccharide (PPSV23) vaccine. You may need one or two doses if you smoke cigarettes or if  you have certain conditions.  Meningococcal vaccine. You may need this if you have certain conditions.  Hepatitis A vaccine. You may need this if you have certain conditions or if you travel or work in places where you may be exposed to hepatitis A.  Hepatitis B vaccine. You may need this if you have certain conditions or if you travel or work in places where you may be exposed to hepatitis B.  Haemophilus influenzae type b (Hib) vaccine. You may need this if you have certain conditions.  Talk to your health care provider about which screenings and vaccines you need and how often you need them. This information is not intended to replace advice given to you by your health care provider. Make sure you discuss any questions you have with your health care provider. Document Released: 08/22/2015 Document Revised: 04/14/2016 Document Reviewed: 05/27/2015 Elsevier Interactive Patient Education  2018 [employer].         No follow-ups on file.  Lucretia Kern, DO

## 2018-03-23 ENCOUNTER — Encounter: Payer: Self-pay | Admitting: Family Medicine

## 2018-03-23 ENCOUNTER — Ambulatory Visit (INDEPENDENT_AMBULATORY_CARE_PROVIDER_SITE_OTHER): Payer: BLUE CROSS/BLUE SHIELD | Admitting: Family Medicine

## 2018-03-23 VITALS — BP 100/78 | HR 75 | Temp 98.8°F | Ht 62.5 in | Wt 182.4 lb

## 2018-03-23 DIAGNOSIS — Z1331 Encounter for screening for depression: Secondary | ICD-10-CM | POA: Diagnosis not present

## 2018-03-23 DIAGNOSIS — IMO0001 Reserved for inherently not codable concepts without codable children: Secondary | ICD-10-CM

## 2018-03-23 DIAGNOSIS — Z789 Other specified health status: Secondary | ICD-10-CM

## 2018-03-23 DIAGNOSIS — F439 Reaction to severe stress, unspecified: Secondary | ICD-10-CM | POA: Diagnosis not present

## 2018-03-23 DIAGNOSIS — E669 Obesity, unspecified: Secondary | ICD-10-CM

## 2018-03-23 DIAGNOSIS — Z23 Encounter for immunization: Secondary | ICD-10-CM

## 2018-03-23 DIAGNOSIS — Z0000 Encounter for general adult medical examination without abnormal findings: Secondary | ICD-10-CM

## 2018-03-23 LAB — LIPID PANEL
CHOLESTEROL: 186 mg/dL (ref 0–200)
HDL: 45.8 mg/dL (ref >39.00)
LDL Cholesterol: 117 mg/dL — ABNORMAL HIGH (ref 0–99)
NonHDL: 140.1
Total CHOL/HDL Ratio: 4
Triglycerides: 116 mg/dL (ref 0.0–149.0)
VLDL: 23.2 mg/dL (ref 0.0–40.0)

## 2018-03-23 LAB — HEMOGLOBIN A1C: Hgb A1c MFr Bld: 5.5 % (ref 4.6–6.5)

## 2018-03-23 NOTE — Patient Instructions (Signed)
BEFORE YOU LEAVE: -tetanus booster -labs -follow up: 3-4 months for stress, yearly for physical  Call the breast center today to schedule your mammogram for breast cancer screening.  Get flu shot at work or here in October.  Call to schedule counseling to help with stress. Please follow up sooner if worsening or other concerns.   We have ordered labs or studies at this visit. It can take up to 1-2 weeks for results and processing. IF results require follow up or explanation, we will call you with instructions. Clinically stable results will be released to your [facility]. If you have not heard from [place] or cannot find your results in [facility] in 2 weeks please contact our office at [phone].  If you are not yet signed up for [facility], please consider signing up.   Preventive Care 40-64 Years, Female Preventive care refers to lifestyle choices and visits with your health care provider that can promote health and wellness. What does preventive care include?  A yearly physical exam. This is also called an annual well check.  Dental exams once or twice a year.  Routine eye exams. Ask your health care provider how often you should have your eyes checked.  Personal lifestyle choices, including: ? Daily care of your teeth and gums. ? Regular physical activity. ? Eating a healthy diet. ? Avoiding tobacco and drug use. ? Limiting alcohol use. ? Practicing safe sex. ? Taking vitamin and mineral supplements as recommended by your health care provider. What happens during an annual well check? The services and screenings done by your health care provider during your annual well check will depend on your age, overall health, lifestyle risk factors, and family history of disease. Counseling Your health care provider may ask you questions about your:  Alcohol use.  Tobacco use.  Drug use.  Emotional well-being.  Home and relationship well-being.  Sexual activity.  Eating  habits.  Work and work Statistician.  Method of birth control.  Menstrual cycle.  Pregnancy history.  Screening You may have the following tests or measurements:  Height, weight, and BMI.  Blood pressure.  Lipid and cholesterol levels. These may be checked every 5 years, or more frequently if you are over 15 years old.  Skin check.  Lung cancer screening. You may have this screening every year starting at age 27 if you have a 30-pack-year history of smoking and currently smoke or have quit within the past 15 years.  Fecal occult blood test (FOBT) of the stool. You may have this test every year starting at age 42.  Flexible sigmoidoscopy or colonoscopy. You may have a sigmoidoscopy every 5 years or a colonoscopy every 10 years starting at age 21.  Hepatitis C blood test.  Hepatitis B blood test.  Sexually transmitted disease (STD) testing.  Diabetes screening. This is done by checking your blood sugar (glucose) after you have not eaten for a while (fasting). You may have this done every 1-3 years.  Mammogram. This may be done every 1-2 years. Talk to your health care provider about when you should start having regular mammograms. This may depend on whether you have a family history of breast cancer.  BRCA-related cancer screening. This may be done if you have a family history of breast, ovarian, tubal, or peritoneal cancers.  Pelvic exam and Pap test. This may be done every 3 years starting at age 82. Starting at age 35, this may be done every 5 years if you have a Pap test  in combination with an HPV test.  Bone density scan. This is done to screen for osteoporosis. You may have this scan if you are at high risk for osteoporosis.  Discuss your test results, treatment options, and if necessary, the need for more tests with your health care provider. Vaccines Your health care provider may recommend certain vaccines, such as:  Influenza vaccine. This is recommended every  year.  Tetanus, diphtheria, and acellular pertussis (Tdap, Td) vaccine. You may need a Td booster every 10 years.  Varicella vaccine. You may need this if you have not been vaccinated.  Zoster vaccine. You may need this after age 71.  Measles, mumps, and rubella (MMR) vaccine. You may need at least one dose of MMR if you were born in 1957 or later. You may also need a second dose.  Pneumococcal 13-valent conjugate (PCV13) vaccine. You may need this if you have certain conditions and were not previously vaccinated.  Pneumococcal polysaccharide (PPSV23) vaccine. You may need one or two doses if you smoke cigarettes or if you have certain conditions.  Meningococcal vaccine. You may need this if you have certain conditions.  Hepatitis A vaccine. You may need this if you have certain conditions or if you travel or work in places where you may be exposed to hepatitis A.  Hepatitis B vaccine. You may need this if you have certain conditions or if you travel or work in places where you may be exposed to hepatitis B.  Haemophilus influenzae type b (Hib) vaccine. You may need this if you have certain conditions.  Talk to your health care provider about which screenings and vaccines you need and how often you need them. This information is not intended to replace advice given to you by your health care provider. Make sure you discuss any questions you have with your health care provider. Document Released: 08/22/2015 Document Revised: 04/14/2016 Document Reviewed: 05/27/2015 Elsevier Interactive Patient Education  [employer].

## 2018-05-22 ENCOUNTER — Ambulatory Visit (INDEPENDENT_AMBULATORY_CARE_PROVIDER_SITE_OTHER): Payer: BLUE CROSS/BLUE SHIELD | Admitting: *Deleted

## 2018-05-22 DIAGNOSIS — Z308 Encounter for other contraceptive management: Secondary | ICD-10-CM | POA: Diagnosis not present

## 2018-05-22 MED ORDER — MEDROXYPROGESTERONE ACETATE 150 MG/ML IM SUSP
150.0000 mg | Freq: Once | INTRAMUSCULAR | Status: AC
  Administered 2018-05-22: 150 mg via INTRAMUSCULAR

## 2018-05-22 NOTE — Progress Notes (Signed)
Per orders of Dr. Selena Batten, injection of Depo Provera 150 mg given by Maisie Fus. Patient tolerated injection well.

## 2018-06-19 DIAGNOSIS — H52223 Regular astigmatism, bilateral: Secondary | ICD-10-CM | POA: Diagnosis not present

## 2018-07-24 ENCOUNTER — Telehealth: Payer: Self-pay | Admitting: *Deleted

## 2018-07-24 NOTE — Telephone Encounter (Signed)
I called the pt and informed her she would be due for the next Depo Provera injection from 08/07/2018-08/21/2018.  Appt scheduled for 08/18/2018.

## 2018-07-24 NOTE — Telephone Encounter (Signed)
Copied from CRM [phone]. Topic: Appointment Scheduling - Scheduling Inquiry for Clinic >> Jul 24, 2018  9:47 AM Fanny BienIlderton, Jessica L wrote: Reason for CRM: pt called and stated that she would like to schedule her depo shot but is not sure of her window time. Please advise

## 2018-07-25 ENCOUNTER — Ambulatory Visit: Payer: BLUE CROSS/BLUE SHIELD | Admitting: Family Medicine

## 2018-08-18 ENCOUNTER — Ambulatory Visit (INDEPENDENT_AMBULATORY_CARE_PROVIDER_SITE_OTHER): Payer: BLUE CROSS/BLUE SHIELD | Admitting: *Deleted

## 2018-08-18 DIAGNOSIS — Z308 Encounter for other contraceptive management: Secondary | ICD-10-CM

## 2018-08-18 MED ORDER — MEDROXYPROGESTERONE ACETATE 150 MG/ML IM SUSP
150.0000 mg | Freq: Once | INTRAMUSCULAR | Status: AC
  Administered 2018-08-18: 150 mg via INTRAMUSCULAR

## 2018-08-18 MED ORDER — MEDROXYPROGESTERONE ACETATE 150 MG/ML IM SUSP: 150.0000 mg | Freq: Once | INTRAMUSCULAR | Status: DC

## 2018-08-18 NOTE — Progress Notes (Signed)
Per orders of Dr. Hassan Rowan, injection of  Depo Provera given by Maisie Fus. Patient tolerated injection well.  Next due date for injection is 11/04/2018 - 11/18/2018

## 2018-11-08 ENCOUNTER — Other Ambulatory Visit: Payer: Self-pay

## 2018-11-08 ENCOUNTER — Ambulatory Visit (INDEPENDENT_AMBULATORY_CARE_PROVIDER_SITE_OTHER): Payer: BLUE CROSS/BLUE SHIELD | Admitting: *Deleted

## 2018-11-08 DIAGNOSIS — Z308 Encounter for other contraceptive management: Secondary | ICD-10-CM | POA: Diagnosis not present

## 2018-11-08 MED ORDER — MEDROXYPROGESTERONE ACETATE 150 MG/ML IM SUSP
150.0000 mg | Freq: Once | INTRAMUSCULAR | Status: AC
  Administered 2018-11-08: 150 mg via INTRAMUSCULAR

## 2018-11-08 NOTE — Progress Notes (Signed)
Per orders of Dr. Hassan Rowan, injection of medroxyprogesterone 150 mg/ml given by Kern Reap. Patient tolerated injection well.

## 2019-01-31 ENCOUNTER — Ambulatory Visit (INDEPENDENT_AMBULATORY_CARE_PROVIDER_SITE_OTHER): Payer: BC Managed Care – PPO

## 2019-01-31 ENCOUNTER — Other Ambulatory Visit: Payer: Self-pay

## 2019-01-31 DIAGNOSIS — Z308 Encounter for other contraceptive management: Secondary | ICD-10-CM | POA: Diagnosis not present

## 2019-01-31 MED ORDER — MEDROXYPROGESTERONE ACETATE 150 MG/ML IM SUSP
150.0000 mg | Freq: Once | INTRAMUSCULAR | Status: AC
  Administered 2019-01-31: 150 mg via INTRAMUSCULAR

## 2019-01-31 NOTE — Progress Notes (Signed)
Per orders of Dr. Maudie Mercury, injection of Depo-Provera given by Rebecca Eaton. Patient tolerated injection well.

## 2019-04-13 ENCOUNTER — Telehealth: Payer: Self-pay | Admitting: Family Medicine

## 2019-04-13 NOTE — Telephone Encounter (Signed)
See note

## 2019-04-13 NOTE — Telephone Encounter (Signed)
I called the pt and informed her the next Depo Provera injection should be given from 9/9-9/23.  Appt scheduled with Dr Jerilee Hoh on 9/17 and pt was advised the injection can be given during this visit.

## 2019-04-13 NOTE — Telephone Encounter (Signed)
Patient is calling to ask Lindsayd the nurse what is the window for her depo shot? Please advise CB- [phone]

## 2019-04-26 ENCOUNTER — Ambulatory Visit: Payer: BC Managed Care – PPO | Admitting: Internal Medicine

## 2019-04-26 ENCOUNTER — Other Ambulatory Visit: Payer: Self-pay

## 2019-04-26 ENCOUNTER — Encounter: Payer: Self-pay | Admitting: Internal Medicine

## 2019-04-26 ENCOUNTER — Encounter: Payer: Self-pay | Admitting: Gastroenterology

## 2019-04-26 VITALS — BP 130/70 | HR 78 | Temp 98.0°F | Ht 62.0 in | Wt 200.6 lb

## 2019-04-26 DIAGNOSIS — Z1239 Encounter for other screening for malignant neoplasm of breast: Secondary | ICD-10-CM

## 2019-04-26 DIAGNOSIS — Z309 Encounter for contraceptive management, unspecified: Secondary | ICD-10-CM

## 2019-04-26 DIAGNOSIS — Z1211 Encounter for screening for malignant neoplasm of colon: Secondary | ICD-10-CM | POA: Diagnosis not present

## 2019-04-26 LAB — POCT URINE PREGNANCY: Preg Test, Ur: NEGATIVE

## 2019-04-26 MED ORDER — MEDROXYPROGESTERONE ACETATE 150 MG/ML IM SUSP
150.0000 mg | Freq: Once | INTRAMUSCULAR | Status: AC
  Administered 2019-04-26: 150 mg via INTRAMUSCULAR

## 2019-04-26 NOTE — Addendum Note (Signed)
Addended by: Westley Hummer B on: 04/26/2019 04:44 PM   Modules accepted: Orders

## 2019-04-26 NOTE — Patient Instructions (Signed)
-  Nice seeing you today!!  -Schedule follow up for your physical at your convenience.  -Referral to GYN has been requested.

## 2019-04-26 NOTE — Progress Notes (Signed)
Established Patient Office Visit     CC/Reason for Visit: Establish care, contraception management  HPI: Ariel Hudson is a 48 y.o. female who is coming in today for the above mentioned reasons.  She has no past medical history of significance.  She is due for her Depo-Provera injection which she would like to do today.  Other than this her history is only significant for mild obesity.  Past surgical history is only significant for C-section 12 years ago.  She has 3 children ages 5526, 5623 and 5312.  Family history significant for breast cancer in her paternal grandmother.  She has been having some hot flashes.  She wonders if she is now menopausal.  She has not had a period in years as she is still receiving Depakote.  She has no acute complaints today.   Past Medical/Surgical History: Past Medical History:  Diagnosis Date  . 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 Migraines   . Streptococcal sore throat 05/31/2013    Past Surgical History:  Procedure Laterality Date  . CESAREAN SECTION  2008    Social History:  reports that she has never smoked. She has never used smokeless tobacco. She reports current alcohol use. No history on file for drug.  Allergies: No Known Allergies  Family History:  Family History  Problem Relation Age of Onset  . Breast cancer Unknown        grandmother  . Hypertension Father   . Heart defect Mother        [facility] patient-needs valve replacement     Current Outpatient Medications:  .  medroxyPROGESTERone (DEPO-PROVERA) 150 MG/ML injection, Inject 150 mg into the muscle every 3 (three) months., Disp: , Rfl:   Review of Systems:  Constitutional: Denies fever, chills, diaphoresis, appetite change and fatigue.  HEENT: Denies photophobia, eye pain, redness, hearing loss, ear pain, congestion, sore throat, rhinorrhea, sneezing, mouth sores, trouble swallowing, neck pain, neck stiffness and  tinnitus.   Respiratory: Denies SOB, DOE, cough, chest tightness,  and wheezing.   Cardiovascular: Denies chest pain, palpitations and leg swelling.  Gastrointestinal: Denies nausea, vomiting, abdominal pain, diarrhea, constipation, blood in stool and abdominal distention.  Genitourinary: Denies dysuria, urgency, frequency, hematuria, flank pain and difficulty urinating.  Endocrine: Denies: hot or cold intolerance, sweats, changes in hair or nails, polyuria, polydipsia. Musculoskeletal: Denies myalgias, back pain, joint swelling, arthralgias and gait problem.  Skin: Denies pallor, rash and wound.  Neurological: Denies dizziness, seizures, syncope, weakness, light-headedness, numbness and headaches.  Hematological: Denies adenopathy. Easy bruising, personal or family bleeding history  Psychiatric/Behavioral: Denies suicidal ideation, mood changes, confusion, nervousness, sleep disturbance and agitation    Physical Exam: Vitals:   04/26/19 1044  BP: 130/70  Pulse: 78  Temp: 98 F (36.7 C)  TempSrc: Temporal  SpO2: 98%  Weight: 200 lb 9.6 oz (91 kg)  Height: 5\' 2"  (1.575 m)    Body mass index is 36.69 kg/m.   Constitutional: NAD, calm, comfortable Eyes: PERRL, lids and conjunctivae normal ENMT: Mucous membranes are moist.  Respiratory: clear to auscultation bilaterally, no wheezing, no crackles. Normal respiratory effort. No accessory muscle use.  Cardiovascular: Regular rate and rhythm, no murmurs / rubs / gallops. No extremity edema. 2+ pedal pulses. No carotid bruits.  Psychiatric: Normal judgment and insight. Alert and oriented x 3. Normal mood.    Impression and Plan:  Encounter for other screening for malignant  neoplasm of breast  - Plan: MM Digital Screening  Screening for malignant neoplasm of colon  - Plan: Ambulatory referral to Gastroenterology  Encounter for contraceptive management, unspecified type -She is on time for her Depo-Provera injection. -Will send  a referral to GYN she is interested in finding out if she is now menopausal.    Patient Instructions  -Nice seeing you today!!  -Schedule follow up for your physical at your convenience.  -Referral to GYN has been requested.     Lelon Frohlich, MD [facility]

## 2019-05-15 ENCOUNTER — Ambulatory Visit (AMBULATORY_SURGERY_CENTER): Payer: Self-pay | Admitting: *Deleted

## 2019-05-15 ENCOUNTER — Other Ambulatory Visit: Payer: Self-pay

## 2019-05-15 VITALS — Temp 97.5°F | Ht 62.0 in | Wt 200.0 lb

## 2019-05-15 DIAGNOSIS — Z1211 Encounter for screening for malignant neoplasm of colon: Secondary | ICD-10-CM

## 2019-05-15 MED ORDER — SUPREP BOWEL PREP KIT 17.5-3.13-1.6 GM/177ML PO SOLN: 1.0000 | Freq: Once | ORAL | 0 refills | Status: AC

## 2019-05-15 NOTE — Progress Notes (Signed)
No egg or soy allergy known to patient  No issues with past sedation with any surgeries  or procedures, no intubation problems  No diet pills per patient No home 02 use per patient  No blood thinners per patient  Pt denies issues with constipation  No A fib or A flutter  EMMI video sent to pt's e mail  Suprep $15 coupon to pt   Due to the COVID-19 pandemic we are asking patients to follow these guidelines. Please only bring one care partner. Please be aware that your care partner may wait in the car in the parking lot or if they feel like they will be too hot to wait in the car, they may wait in the lobby on the 4th floor. All care partners are required to wear a mask the entire time (we do not have any that we can provide them), they need to practice social distancing, and we will do a Covid check for all patient's and care partners when you arrive. Also we will check their temperature and your temperature. If the care partner waits in their car they need to stay in the parking lot the entire time and we will call them on their cell phone when the patient is ready for discharge so they can bring the car to the front of the building. Also all patient's will need to wear a mask into building.  

## 2019-05-17 ENCOUNTER — Encounter: Payer: Self-pay | Admitting: Obstetrics & Gynecology

## 2019-05-17 ENCOUNTER — Encounter: Payer: Self-pay | Admitting: Gastroenterology

## 2019-05-17 ENCOUNTER — Ambulatory Visit: Payer: BC Managed Care – PPO | Admitting: Obstetrics & Gynecology

## 2019-05-17 ENCOUNTER — Other Ambulatory Visit: Payer: Self-pay

## 2019-05-17 VITALS — BP 130/78 | Ht 62.0 in | Wt 199.0 lb

## 2019-05-17 DIAGNOSIS — Z3042 Encounter for surveillance of injectable contraceptive: Secondary | ICD-10-CM | POA: Diagnosis not present

## 2019-05-17 DIAGNOSIS — Z01419 Encounter for gynecological examination (general) (routine) without abnormal findings: Secondary | ICD-10-CM | POA: Diagnosis not present

## 2019-05-17 DIAGNOSIS — Z6836 Body mass index (BMI) 36.0-36.9, adult: Secondary | ICD-10-CM

## 2019-05-17 DIAGNOSIS — N951 Menopausal and female climacteric states: Secondary | ICD-10-CM

## 2019-05-17 DIAGNOSIS — E6609 Other obesity due to excess calories: Secondary | ICD-10-CM | POA: Diagnosis not present

## 2019-05-17 DIAGNOSIS — R87610 Atypical squamous cells of undetermined significance on cytologic smear of cervix (ASC-US): Secondary | ICD-10-CM | POA: Diagnosis not present

## 2019-05-17 NOTE — Patient Instructions (Signed)
1. Encounter for routine gynecological examination with Papanicolaou smear of cervix Normal gynecologic exam.  Pap reflex done.  Breast exam normal.  Scheduling screening mammogram at the breast center.  Colonoscopy through her family physician.  Health labs with family physician.  2. Encounter for surveillance of injectable contraceptive Well on Depo-Provera injections for contraception.  No contraindication to continue.  We will continue until develops frank menopausal symptoms or until at least age 55 if no symptoms.  3. Perimenopause No frank symptoms of menopause.  In that situation, it is better to continue on Depo-Provera injections to assure contraception.  4. Class 2 obesity due to excess calories without serious comorbidity with body mass index (BMI) of 36.0 to 36.9 in adult Recommend a lower calorie/carb diet such as [employer].  Aerobic physical activities 5 times a week and weightlifting every 2 days.  Faria, fue un placer encontrarle hoy!  Voy a informarle de sus [employer].

## 2019-05-17 NOTE — Progress Notes (Signed)
Ariel Hudson 1971/07/31 [id number]   History:    48 y.o. G3P3L3 Married.  Son and daughter in 71's and youngest daughter 22 yo.  RP:  New patient presenting for annual gyn exam   HPI: Well on Depo-Provera for contraception.  No breakthrough bleeding.  No pelvic pain.  No hot flashes or night sweats.  No pain or dryness with intercourse.  Urine and bowel movements normal.  Breasts normal.  Body mass index 36.4.  Needs to increase physical activities and decrease calories.  Health labs with family physician.  Past medical history,surgical history, family history and social history were all reviewed and documented in the EPIC chart.  Gynecologic History No LMP recorded. Patient has had an injection. Contraception: Depo-Provera injections Last Pap: 03/2016. Results were: Negative Last mammogram: 05/2013. Results were: Negative.  [facility]. Bone Density: Never Colonoscopy: Will schedule Colonoscopy through Fam MD  Obstetric History OB History  Gravida Para Term Preterm AB Living  3 3     0 3  SAB TAB Ectopic Multiple Live Births      0        # Outcome Date GA Lbr Len/2nd Weight Sex Delivery Anes PTL Lv  3 Para           2 Para           1 Para              ROS: A ROS was performed and pertinent positives and negatives are included in the history.  GENERAL: No fevers or chills. HEENT: No change in vision, no earache, sore throat or sinus congestion. NECK: No pain or stiffness. CARDIOVASCULAR: No chest pain or pressure. No palpitations. PULMONARY: No shortness of breath, cough or wheeze. GASTROINTESTINAL: No abdominal pain, nausea, vomiting or diarrhea, melena or bright red blood per rectum. GENITOURINARY: No urinary frequency, urgency, hesitancy or dysuria. MUSCULOSKELETAL: No joint or muscle pain, no back pain, no recent trauma. DERMATOLOGIC: No rash, no itching, no lesions. ENDOCRINE: No polyuria, polydipsia, no heat or cold intolerance. No  recent change in weight. HEMATOLOGICAL: No anemia or easy bruising or bleeding. NEUROLOGIC: No headache, seizures, numbness, tingling or weakness. PSYCHIATRIC: No depression, no loss of interest in normal activity or change in sleep pattern.     Exam:   BP 130/78 (BP Location: Right Arm, Patient Position: Sitting, Cuff Size: Normal)   Ht 5\' 2"  (1.575 m)   Wt 199 lb (90.3 kg)   BMI 36.40 kg/m   Body mass index is 36.4 kg/m.  General appearance : Well developed well nourished female. No acute distress HEENT: Eyes: no retinal hemorrhage or exudates,  Neck supple, trachea midline, no carotid bruits, no thyroidmegaly Lungs: Clear to auscultation, no rhonchi or wheezes, or rib retractions  Heart: Regular rate and rhythm, no murmurs or gallops Breast:Examined in sitting and supine position were symmetrical in appearance, no palpable masses or tenderness,  no skin retraction, no nipple inversion, no nipple discharge, no skin discoloration, no axillary or supraclavicular lymphadenopathy Abdomen: no palpable masses or tenderness, no rebound or guarding Extremities: no edema or skin discoloration or tenderness  Pelvic: Vulva: Normal             Vagina: No gross lesions or discharge  Cervix: No gross lesions or discharge.  Pap reflex done  Uterus  AV, normal size, shape and consistency, non-tender and mobile  Adnexa  Without masses or tenderness  Anus: Normal   Assessment/Plan:  48 y.o. female for annual exam   1. Encounter for routine gynecological examination with Papanicolaou smear of cervix Normal gynecologic exam.  Pap reflex done.  Breast exam normal.  Scheduling screening mammogram at the breast center.  Colonoscopy through her family physician.  Health labs with family physician.  2. Encounter for surveillance of injectable contraceptive Well on Depo-Provera injections for contraception.  No contraindication to continue.  We will continue until develops frank menopausal symptoms or  until at least age 19 if no symptoms.  3. Perimenopause No frank symptoms of menopause.  In that situation, it is better to continue on Depo-Provera injections to assure contraception.  4. Class 2 obesity due to excess calories without serious comorbidity with body mass index (BMI) of 36.0 to 36.9 in adult Recommend a lower calorie/carb diet such as [employer].  Aerobic physical activities 5 times a week and weightlifting every 2 days.  [name] MD, 3:55 PM 05/17/2019

## 2019-05-21 ENCOUNTER — Ambulatory Visit: Payer: BC Managed Care – PPO

## 2019-05-21 LAB — PAP, TP IMAGING W/ HPV RNA, RFLX HPV TYPE 16,18/45: HPV DNA High Risk: DETECTED — AB

## 2019-05-28 ENCOUNTER — Telehealth: Payer: Self-pay

## 2019-05-28 NOTE — Telephone Encounter (Signed)
Covid-19 screening questions   Do you now or have you had a fever in the last 14 days? NO   Do you have any respiratory symptoms of shortness of breath or cough now or in the last 14 days? NO  Do you have any family members or close contacts with diagnosed or suspected Covid-19 in the past 14 days? NO  Have you been tested for Covid-19 and found to be positive? NO        

## 2019-05-29 ENCOUNTER — Ambulatory Visit (AMBULATORY_SURGERY_CENTER): Payer: BC Managed Care – PPO | Admitting: Gastroenterology

## 2019-05-29 ENCOUNTER — Encounter: Payer: Self-pay | Admitting: Gastroenterology

## 2019-05-29 ENCOUNTER — Other Ambulatory Visit: Payer: Self-pay

## 2019-05-29 VITALS — BP 122/69 | HR 66 | Temp 98.6°F | Resp 12 | Ht 62.0 in | Wt 200.0 lb

## 2019-05-29 DIAGNOSIS — Z1211 Encounter for screening for malignant neoplasm of colon: Secondary | ICD-10-CM

## 2019-05-29 MED ORDER — SODIUM CHLORIDE 0.9 % IV SOLN: 500.0000 mL | Freq: Once | INTRAVENOUS | Status: DC

## 2019-05-29 NOTE — Op Note (Signed)
[facility] Patient Name: Ariel Hudson Procedure Date: 05/29/2019 9:16 AM MRN: [id number] Endoscopist: Napoleon FormKavitha V. Jermane Brayboy , MD Age: [age] Referring MD:  Date of Birth: Dec 14, 1970 Gender: Female Account #: [id number] Procedure:                Colonoscopy Indications:              Screening for colorectal malignant neoplasm Medicines:                Monitored Anesthesia Care Procedure:                Pre-Anesthesia Assessment:                           - Prior to the procedure, a History and Physical                            was performed, and patient medications and                            allergies were reviewed. The patient's tolerance of                            previous anesthesia was also reviewed. The risks                            and benefits of the procedure and the sedation                            options and risks were discussed with the patient.                            All questions were answered, and informed consent                            was obtained. Prior Anticoagulants: The patient has                            taken no previous anticoagulant or antiplatelet                            agents. ASA Grade Assessment: II - A patient with                            mild systemic disease. After reviewing the risks                            and benefits, the patient was deemed in                            satisfactory condition to undergo the procedure.                           After obtaining informed consent, the colonoscope  was passed under direct vision. Throughout the                            procedure, the patient's blood pressure, pulse, and                            oxygen saturations were monitored continuously. The                            Colonoscope was introduced through the anus and                            advanced to the the cecum, identified by                            appendiceal orifice  and ileocecal valve. The                            colonoscopy was performed without difficulty. The                            patient tolerated the procedure well. The quality                            of the bowel preparation was excellent. The                            ileocecal valve, appendiceal orifice, and rectum                            were photographed. Scope In: 9:19:36 AM Scope Out: 9:29:42 AM Scope Withdrawal Time: 0 hours 7 minutes 28 seconds  Total Procedure Duration: 0 hours 10 minutes 6 seconds  Findings:                 The perianal and digital rectal examinations were                            normal.                           Non-bleeding internal hemorrhoids were found during                            retroflexion. The hemorrhoids were small. Complications:            No immediate complications. Estimated Blood Loss:     Estimated blood loss was minimal. Impression:               - Non-bleeding internal hemorrhoids.                           - No specimens collected. Recommendation:           - Patient has a contact number available for  emergencies. The signs and symptoms of potential                            delayed complications were discussed with the                            patient. Return to normal activities tomorrow.                            Written discharge instructions were provided to the                            patient.                           - Resume previous diet.                           - Continue present medications.                           - Await pathology results.                           - Repeat colonoscopy in 10 years for screening                            purposes. Napoleon Form, MD 05/29/2019 9:34:39 AM This report has been signed electronically.

## 2019-05-29 NOTE — Progress Notes (Signed)
Report to PACU, RN, vss, BBS= Clear.  

## 2019-05-29 NOTE — Progress Notes (Signed)
History reviewed 

## 2019-05-29 NOTE — Patient Instructions (Signed)
YOU HAD AN ENDOSCOPIC PROCEDURE TODAY AT THE [facility]:   Refer to the procedure report that was given to you for any specific questions about what was found during the examination.  If the procedure report does not answer your questions, please call your gastroenterologist to clarify.  If you requested that your care partner not be given the details of your procedure findings, then the procedure report has been included in a sealed envelope for you to review at your convenience later.  YOU SHOULD EXPECT: Some feelings of bloating in the abdomen. Passage of more gas than usual.  Walking can help get rid of the air that was put into your GI tract during the procedure and reduce the bloating. If you had a lower endoscopy (such as a colonoscopy or flexible sigmoidoscopy) you may notice spotting of blood in your stool or on the toilet paper. If you underwent a bowel prep for your procedure, you may not have a normal bowel movement for a few days.  Please Note:  You might notice some irritation and congestion in your nose or some drainage.  This is from the oxygen used during your procedure.  There is no need for concern and it should clear up in a day or so.  SYMPTOMS TO REPORT IMMEDIATELY:   Following lower endoscopy (colonoscopy or flexible sigmoidoscopy):  Excessive amounts of blood in the stool  Significant tenderness or worsening of abdominal pains  Swelling of the abdomen that is new, acute  Fever of 100F or higher   For urgent or emergent issues, a gastroenterologist can be reached at any hour by calling [phone].   DIET:  We do recommend a small meal at first, but then you may proceed to your regular diet.  Drink plenty of fluids but you should avoid alcoholic beverages for 24 hours.  ACTIVITY:  You should plan to take it easy for the rest of today and you should NOT DRIVE or use heavy machinery until tomorrow (because of the sedation medicines used during the test).     FOLLOW UP: Our staff will call the number listed on your records 48-72 hours following your procedure to check on you and address any questions or concerns that you may have regarding the information given to you following your procedure. If we do not reach you, we will leave a message.  We will attempt to reach you two times.  During this call, we will ask if you have developed any symptoms of COVID 19. If you develop any symptoms (ie: fever, flu-like symptoms, shortness of breath, cough etc.) before then, please call [phone].  If you test positive for Covid 19 in the 2 weeks post procedure, please call and report this information to us.    If any biopsies were taken you will be contacted by phone or by letter within the next 1-3 weeks.  Please call us at [phone] if you have not heard about the biopsies in 3 weeks.    SIGNATURES/CONFIDENTIALITY: You and/or your care partner have signed paperwork which will be entered into your electronic medical record.  These signatures attest to the fact that that the information above on your After Visit Summary has been reviewed and is understood.  Full responsibility of the confidentiality of this discharge information lies with you and/or your care-partner.  Thank you for letting us take care of your healthcare needs today. 

## 2019-05-31 ENCOUNTER — Telehealth: Payer: Self-pay | Admitting: *Deleted

## 2019-05-31 ENCOUNTER — Telehealth: Payer: Self-pay

## 2019-05-31 NOTE — Telephone Encounter (Signed)
  Follow up Call-  Call back number 05/29/2019  Post procedure Call Back phone  # [phone]  Permission to leave phone message Yes  Some recent data might be hidden     Patient questions:  Do you have a fever, pain , or abdominal swelling? No. Pain Score  0 *  Have you tolerated food without any problems? Yes.    Have you been able to return to your normal activities? Yes.    Do you have any questions about your discharge instructions: Diet   No. Medications  No. Follow up visit  No.  Do you have questions or concerns about your Care? No.  Actions: * If pain score is 4 or above: No action needed, pain <4. 1. Have you developed a fever since your procedure? no  2.   Have you had an respiratory symptoms (SOB or cough) since your procedure? no  3.   Have you tested positive for COVID 19 since your procedure no  4.   Have you had any family members/close contacts diagnosed with the COVID 19 since your procedure?  no   If yes to any of these questions please route to Joylene John, RN and Alphonsa Gin, Therapist, sports.

## 2019-05-31 NOTE — Telephone Encounter (Signed)
Follow up call attempted.  NALM  

## 2019-06-29 ENCOUNTER — Other Ambulatory Visit: Payer: Self-pay

## 2019-06-29 ENCOUNTER — Ambulatory Visit
Admission: RE | Admit: 2019-06-29 | Discharge: 2019-06-29 | Disposition: A | Discharge status: Home / Self Care | Payer: BC Managed Care – PPO | Source: Ambulatory Visit | Attending: Internal Medicine | Admitting: Internal Medicine

## 2019-06-29 DIAGNOSIS — Z1231 Encounter for screening mammogram for malignant neoplasm of breast: Secondary | ICD-10-CM | POA: Diagnosis not present

## 2019-06-29 DIAGNOSIS — Z1239 Encounter for other screening for malignant neoplasm of breast: Secondary | ICD-10-CM

## 2019-07-12 ENCOUNTER — Ambulatory Visit: Payer: 59 | Admitting: Obstetrics & Gynecology

## 2019-07-12 ENCOUNTER — Other Ambulatory Visit: Payer: Self-pay

## 2019-07-12 ENCOUNTER — Encounter: Payer: Self-pay | Admitting: Obstetrics & Gynecology

## 2019-07-12 VITALS — BP 126/84

## 2019-07-12 DIAGNOSIS — N870 Mild cervical dysplasia: Secondary | ICD-10-CM | POA: Diagnosis not present

## 2019-07-12 DIAGNOSIS — R87810 Cervical high risk human papillomavirus (HPV) DNA test positive: Secondary | ICD-10-CM | POA: Diagnosis not present

## 2019-07-12 DIAGNOSIS — Z113 Encounter for screening for infections with a predominantly sexual mode of transmission: Secondary | ICD-10-CM

## 2019-07-12 DIAGNOSIS — R87610 Atypical squamous cells of undetermined significance on cytologic smear of cervix (ASC-US): Secondary | ICD-10-CM

## 2019-07-12 NOTE — Progress Notes (Signed)
    Ariel Hudson [birth year] [id number]        48 y.o.  G3P0003 Married  RP: ASCUS/HPV HR positive for Colposcopy  HPI: H/O Cryoablation of cervix 12 yrs ago.  Pap tests normal since then, until this last one 05/17/2019 showing ASCUS/HPV HR pos.  Pap test in 2017 Negative/HPV HR neg.   OB History  Gravida Para Term Preterm AB Living  3 3     0 3  SAB TAB Ectopic Multiple Live Births      0        # Outcome Date GA Lbr Len/2nd Weight Sex Delivery Anes PTL Lv  3 Para           2 Para           1 Para             Past medical history,surgical history, problem list, medications, allergies, family history and social history were all reviewed and documented in the EPIC chart.   Directed ROS with pertinent positives and negatives documented in the history of present illness/assessment and plan.  Exam:  Vitals:   07/12/19 1608  BP: 126/84   General appearance:  Normal  Colposcopy Procedure Note Ariel Hudson [birth year]  Indications: ASCUS/HPV HR positive for Colpo  Procedure Details  The risks and benefits of the procedure and Verbal informed consent obtained.  Speculum placed in vagina and excellent visualization of cervix achieved, cervix swabbed x 3 with acetic acid solution.  Findings:  Cervix colposcopy: Physical Exam Genitourinary:      Vaginal colposcopy: Normal  Vulvar colposcopy: Normal  Perirectal colposcopy: Normal  The cervix was sprayed with Hurricane before performing the cervical biopsies.  Specimens: HPV 16-18-45 done.  Gono-Chlam done.  Cervical Bx at 6 and 12 O'Clock  Complications:  None, hemostasis with Silver Nitrate and Monsel . Plan:  Management per results   Assessment/Plan:  48 y.o. G3P0003   1. 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  Colposcopy procedure explained.  Colposcopy findings reviewed with patient.  Postprocedure precautions discussed.  Management per cervical biopsy results as  well as HPV 16-18-45.  2. Screen for STD (sexually transmitted disease) Gonorrhea and chlamydia done on cervix.  Other orders - Pathology Report ([facility]) - HPV Type 16 and 18/45 RNA - GC Probe Amp, ThinPrep - Chlamydia Probe Amp, ThinPrep  Counseling on above issues and coordination of care more than 50% for 10 minutes.  [name] MD, 4:42 PM 07/12/2019

## 2019-07-16 LAB — CHLAMYDIA PROBE AMP THINPREP: C. trachomatis RNA, TMA: NOT DETECTED

## 2019-07-16 LAB — GC PROBE AMP THINPREP: N. gonorrhoeae RNA, TMA: NOT DETECTED

## 2019-07-16 LAB — HPV TYPE 16 AND 18/45 RNA
HPV Type 16 RNA: NOT DETECTED
HPV Type 18/45 RNA: NOT DETECTED

## 2019-07-18 LAB — TISSUE PATH REPORT

## 2019-07-18 LAB — PATHOLOGY REPORT

## 2019-07-19 ENCOUNTER — Encounter: Payer: Self-pay | Admitting: Obstetrics & Gynecology

## 2019-07-19 NOTE — Patient Instructions (Signed)
1. 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  Colposcopy procedure explained.  Colposcopy findings reviewed with patient.  Postprocedure precautions discussed.  Management per cervical biopsy results as well as HPV 16-18-45.  2. Screen for STD (sexually transmitted disease) Gonorrhea and chlamydia done on cervix.  Other orders - Pathology Report ([facility]) - HPV Type 16 and 18/45 RNA - GC Probe Amp, ThinPrep - Chlamydia Probe Amp, ThinPrep  Eliot, fue un placer verle hoy!  Voy a informarle de sus [employer].

## 2019-07-26 ENCOUNTER — Encounter: Payer: Self-pay | Admitting: Internal Medicine

## 2019-07-26 ENCOUNTER — Ambulatory Visit: Payer: 59 | Admitting: Internal Medicine

## 2019-07-26 ENCOUNTER — Other Ambulatory Visit: Payer: Self-pay

## 2019-07-26 VITALS — BP 110/70 | HR 83 | Temp 97.9°F | Wt 200.4 lb

## 2019-07-26 DIAGNOSIS — Z3042 Encounter for surveillance of injectable contraceptive: Secondary | ICD-10-CM

## 2019-07-26 DIAGNOSIS — Z23 Encounter for immunization: Secondary | ICD-10-CM

## 2019-07-26 DIAGNOSIS — R87610 Atypical squamous cells of undetermined significance on cytologic smear of cervix (ASC-US): Secondary | ICD-10-CM

## 2019-07-26 MED ORDER — MEDROXYPROGESTERONE ACETATE 150 MG/ML IM SUSP
150.0000 mg | Freq: Once | INTRAMUSCULAR | Status: AC
  Administered 2019-07-26: 16:00:00 150 mg via INTRAMUSCULAR

## 2019-07-26 NOTE — Patient Instructions (Signed)
-  Nice seeing you today!!  -Shingles vaccine today.  -Depo shot today. Nurse visit in 3 months for repeat dose.  -Schedule follow up with me in 1 year.

## 2019-07-26 NOTE — Progress Notes (Signed)
Established Patient Office Visit     This visit occurred during the SARS-CoV-2 public health emergency.  Safety protocols were in place, including screening questions prior to the visit, additional usage of staff PPE, and extensive cleaning of exam room while observing appropriate contact time as indicated for disinfecting solutions.    CC/Reason for Visit: Contraception management, wants shingles vaccine, discuss abnormal Pap smear  HPI: Ariel Hudson is a 48 y.o. female who is coming in today for the above mentioned reasons. Past Medical History is significant for: Mild obesity only.  She was referred to GYN and saw them in October at which time she was noticed to have a Pap smear with ASCUS and positive HPV.  She then had a colposcopy, and is due to have another colposcopy in 4 months.  High risk HPV strains were negative.  She has a lot of questions about this today.  She is due for her Depo injection.  She prefers to have it here as the location is more convenient than her GYNs office.  She is also interested in having the shingles vaccine as her husband recently developed shingles.   Past Medical/Surgical History: Past Medical History:  Diagnosis Date  . Allergy   . Anemia    as teenager  . Anxiety    due to COVID   . 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 Migraines   . Streptococcal sore throat 05/31/2013    Past Surgical History:  Procedure Laterality Date  . CESAREAN SECTION  2008    Social History:  reports that she has never smoked. She has never used smokeless tobacco. She reports current alcohol use. She reports that she does not use drugs.  Allergies: No Known Allergies  Family History:  Family History  Problem Relation Age of Onset  . Breast cancer Other        grandmother  . Hypertension Father   . Heart defect Mother        recently admitted to [facility] per patient-needs valve replacement  . Colon cancer Neg Hx   .  Colon polyps Neg Hx   . Esophageal cancer Neg Hx   . Rectal cancer Neg Hx   . Stomach cancer Neg Hx      Current Outpatient Medications:  .  acetaminophen (TYLENOL) 500 MG tablet, Take 1,000 mg by mouth every 6 (six) hours as needed., Disp: , Rfl:  .  cholecalciferol (VITAMIN D3) 25 MCG (1000 UT) tablet, Take 1,000 Units by mouth daily., Disp: , Rfl:  .  medroxyPROGESTERone (DEPO-PROVERA) 150 MG/ML injection, Inject 150 mg into the muscle every 3 (three) months., Disp: , Rfl:  .  Multiple Vitamin (MULTIVITAMIN) tablet, Take 1 tablet by mouth daily., Disp: , Rfl:  .  [employer] WORT PO, Take by mouth daily., Disp: , Rfl:   Review of Systems:  Constitutional: Denies fever, chills, diaphoresis, appetite change and fatigue.  HEENT: Denies photophobia, eye pain, redness, hearing loss, ear pain, congestion, sore throat, rhinorrhea, sneezing, mouth sores, trouble swallowing, neck pain, neck stiffness and tinnitus.   Respiratory: Denies SOB, DOE, cough, chest tightness,  and wheezing.   Cardiovascular: Denies chest pain, palpitations and leg swelling.  Gastrointestinal: Denies nausea, vomiting, abdominal pain, diarrhea, constipation, blood in stool and abdominal distention.  Genitourinary: Denies dysuria, urgency, frequency, hematuria, flank pain and difficulty urinating.  Endocrine: Denies: hot or cold intolerance, sweats, changes in hair or nails, polyuria, polydipsia.  Musculoskeletal: Denies myalgias, back pain, joint swelling, arthralgias and gait problem.  Skin: Denies pallor, rash and wound.  Neurological: Denies dizziness, seizures, syncope, weakness, light-headedness, numbness and headaches.  Hematological: Denies adenopathy. Easy bruising, personal or family bleeding history  Psychiatric/Behavioral: Denies suicidal ideation, mood changes, confusion, nervousness, sleep disturbance and agitation    Physical Exam: Vitals:   07/26/19 0908  BP: 110/70  Pulse: 83  Temp: 97.9 F (36.6 C)    TempSrc: Temporal  SpO2: 97%  Weight: 200 lb 6.4 oz (90.9 kg)    Body mass index is 36.65 kg/m.   Constitutional: NAD, calm, comfortable Eyes: PERRL, lids and conjunctivae normal ENMT: Mucous membranes are moist.  Respiratory: clear to auscultation bilaterally, no wheezing, no crackles. Normal respiratory effort. No accessory muscle use.  Cardiovascular: Regular rate and rhythm, no murmurs / rubs / gallops. No extremity edema. 2+ pedal pulses.  Abdomen: no tenderness, no masses palpated. No hepatosplenomegaly. Bowel sounds positive.  Musculoskeletal: no clubbing / cyanosis. No joint deformity upper and lower extremities. Good ROM, no contractures. Normal muscle tone.  Skin: no rashes, lesions, ulcers. No induration Neurologic: Grossly intact and nonfocal Psychiatric: Normal judgment and insight. Alert and oriented x 3. Normal mood.    Impression and Plan:  Encounter for surveillance of injectable contraceptive -37-month Depo-Provera injection today.  Atypical squamous cells of undetermined significance on cytologic smear of cervix (ASC-US) -Being followed closely by GYN with a colposcopy 2 weeks ago and plans for follow-up colposcopy in 4 months.  Need for shingles vaccine -Shingles vaccine today.    Patient Instructions  -Nice seeing you today!!  -Shingles vaccine today.  -Depo shot today. Nurse visit in 3 months for repeat dose.  -Schedule follow up with me in 1 year.     Lelon Frohlich, MD [facility]

## 2019-07-26 NOTE — Addendum Note (Signed)
Addended by: Westley Hummer B on: 07/26/2019 04:28 PM   Modules accepted: Orders

## 2019-10-19 ENCOUNTER — Other Ambulatory Visit: Payer: Self-pay

## 2019-10-22 ENCOUNTER — Other Ambulatory Visit: Payer: Self-pay

## 2019-10-22 ENCOUNTER — Ambulatory Visit (INDEPENDENT_AMBULATORY_CARE_PROVIDER_SITE_OTHER): Payer: 59 | Admitting: *Deleted

## 2019-10-22 DIAGNOSIS — Z3042 Encounter for surveillance of injectable contraceptive: Secondary | ICD-10-CM

## 2019-10-22 DIAGNOSIS — Z23 Encounter for immunization: Secondary | ICD-10-CM | POA: Diagnosis not present

## 2019-10-22 MED ORDER — MEDROXYPROGESTERONE ACETATE 150 MG/ML IM SUSP
150.0000 mg | Freq: Once | INTRAMUSCULAR | Status: AC
  Administered 2019-10-22: 150 mg via INTRAMUSCULAR

## 2019-10-22 NOTE — Progress Notes (Signed)
Per orders of Dr. Caryl Never, injection of Medroxyprogesterone 150mg  given by . Patient tolerated injection well.

## 2019-11-20 ENCOUNTER — Ambulatory Visit: Payer: 59 | Admitting: Obstetrics & Gynecology

## 2019-12-06 ENCOUNTER — Other Ambulatory Visit: Payer: Self-pay

## 2019-12-07 ENCOUNTER — Ambulatory Visit (INDEPENDENT_AMBULATORY_CARE_PROVIDER_SITE_OTHER): Payer: 59 | Admitting: Obstetrics & Gynecology

## 2019-12-07 ENCOUNTER — Encounter: Payer: Self-pay | Admitting: Obstetrics & Gynecology

## 2019-12-07 ENCOUNTER — Ambulatory Visit: Payer: 59 | Admitting: Obstetrics & Gynecology

## 2019-12-07 DIAGNOSIS — R87610 Atypical squamous cells of undetermined significance on cytologic smear of cervix (ASC-US): Secondary | ICD-10-CM

## 2019-12-07 DIAGNOSIS — N870 Mild cervical dysplasia: Secondary | ICD-10-CM

## 2019-12-07 DIAGNOSIS — R87810 Cervical high risk human papillomavirus (HPV) DNA test positive: Secondary | ICD-10-CM

## 2019-12-07 NOTE — Patient Instructions (Signed)
1. Dysplasia of cervix, low grade (CIN 1) Colposcopy December 2020 showing CIN-1, at least per pathologist.  Decision to repeat a colposcopy for that reason.  Colposcopy today without complications.  Findings reviewed with patient.  Pending cervical biopsies.  Management per results.  Postprocedure precautions reviewed.  2. ASCUS with positive high risk HPV cervical HPV 16-18-45 negative.  Other orders - Pathology Report ([facility])  Ariel Hudson, it was a pleasure seeing you today!  I will inform you of your results as soon as they are available.

## 2019-12-07 NOTE — Progress Notes (Signed)
    Ariel Hudson 12-30-1970 [id number]        49 y.o.  G3P0003   RP: CIN 1 at least on Colpo 07/2019 for repeat Colposcopy  HPI: ASCUS/HPV HR pos 05/2019.  HPV 16-18-45 Neg.  Colpo 07/2019 CIN 1 at least.   OB History  Gravida Para Term Preterm AB Living  3 3     0 3  SAB TAB Ectopic Multiple Live Births      0        # Outcome Date GA Lbr Len/2nd Weight Sex Delivery Anes PTL Lv  3 Para           2 Para           1 Para             Past medical history,surgical history, problem list, medications, allergies, family history and social history were all reviewed and documented in the EPIC chart.   Directed ROS with pertinent positives and negatives documented in the history of present illness/assessment and plan.  Exam:  There were no vitals filed for this visit. General appearance:  Normal  Colposcopy Procedure Note Ariel Hudson 12/07/2019  Indications: CIN 1 at least  Procedure Details  The risks and benefits of the procedure and Verbal informed consent obtained.  Speculum placed in vagina and excellent visualization of cervix achieved, cervix swabbed x 3 with acetic acid solution.  Findings:  Cervix colposcopy: Physical Exam Genitourinary:       Vaginal colposcopy: Normal  Vulvar colposcopy: Normal  Perirectal colposcopy: Normal  The cervix was sprayed with Hurricane before performing the cervical biopsies.  Specimens: Cervical Bx 2 O'Clock and 9 O'Clock.    Complications:  None, Silver Nitrate for hemostasis . Plan:  Management per results   Assessment/Plan:  49 y.o. G3P0003   1. Dysplasia of cervix, low grade (CIN 1) Colposcopy December 2020 showing CIN-1, at least per pathologist.  Decision to repeat a colposcopy for that reason.  Colposcopy today without complications.  Findings reviewed with patient.  Pending cervical biopsies.  Management per results.  Postprocedure precautions reviewed.  2. ASCUS with positive high risk HPV  cervical HPV 16-18-45 negative.  Other orders - Pathology Report ([facility])  Ariel Del MD, 12:29 PM 12/07/2019

## 2019-12-12 LAB — TISSUE PATH REPORT

## 2019-12-12 LAB — PATHOLOGY REPORT

## 2020-01-14 ENCOUNTER — Other Ambulatory Visit: Payer: Self-pay

## 2020-01-15 ENCOUNTER — Ambulatory Visit (INDEPENDENT_AMBULATORY_CARE_PROVIDER_SITE_OTHER): Payer: No Typology Code available for payment source | Admitting: *Deleted

## 2020-01-15 DIAGNOSIS — Z3042 Encounter for surveillance of injectable contraceptive: Secondary | ICD-10-CM

## 2020-01-15 MED ORDER — MEDROXYPROGESTERONE ACETATE 150 MG/ML IM SUSP
150.0000 mg | Freq: Once | INTRAMUSCULAR | Status: AC
  Administered 2020-01-15: 150 mg via INTRAMUSCULAR

## 2020-01-15 NOTE — Progress Notes (Signed)
Per orders of Dr. Ardyth Harps, injection of Medroxyprogesterone Acetate injection 150mg  given by . Patient tolerated injection well.

## 2020-04-03 ENCOUNTER — Other Ambulatory Visit: Payer: Self-pay

## 2020-04-03 ENCOUNTER — Ambulatory Visit (INDEPENDENT_AMBULATORY_CARE_PROVIDER_SITE_OTHER): Payer: No Typology Code available for payment source | Admitting: *Deleted

## 2020-04-03 DIAGNOSIS — Z3042 Encounter for surveillance of injectable contraceptive: Secondary | ICD-10-CM | POA: Diagnosis not present

## 2020-04-03 MED ORDER — MEDROXYPROGESTERONE ACETATE 150 MG/ML IM SUSP
150.0000 mg | Freq: Once | INTRAMUSCULAR | Status: AC
  Administered 2020-04-03: 150 mg via INTRAMUSCULAR

## 2020-04-03 NOTE — Progress Notes (Signed)
Per orders of Dr. Clent Ridges injection of Medroxyprogesterone acetate 150mg  given by . Patient tolerated injection well.

## 2020-04-04 ENCOUNTER — Ambulatory Visit: Payer: No Typology Code available for payment source

## 2020-05-19 ENCOUNTER — Other Ambulatory Visit: Payer: Self-pay | Admitting: Internal Medicine

## 2020-05-19 DIAGNOSIS — Z1231 Encounter for screening mammogram for malignant neoplasm of breast: Secondary | ICD-10-CM

## 2020-06-23 ENCOUNTER — Other Ambulatory Visit: Payer: Self-pay

## 2020-06-23 ENCOUNTER — Ambulatory Visit (INDEPENDENT_AMBULATORY_CARE_PROVIDER_SITE_OTHER): Payer: No Typology Code available for payment source | Admitting: *Deleted

## 2020-06-23 DIAGNOSIS — Z3042 Encounter for surveillance of injectable contraceptive: Secondary | ICD-10-CM

## 2020-06-23 MED ORDER — MEDROXYPROGESTERONE ACETATE 150 MG/ML IM SUSP
150.0000 mg | Freq: Once | INTRAMUSCULAR | Status: AC
  Administered 2020-06-23: 150 mg via INTRAMUSCULAR

## 2020-06-23 NOTE — Progress Notes (Signed)
Patient in office for Depo contraception injection. Injection administered in Right gluteal muscle.

## 2020-06-30 ENCOUNTER — Ambulatory Visit
Admission: RE | Admit: 2020-06-30 | Discharge: 2020-06-30 | Disposition: A | Discharge status: Home / Self Care | Payer: No Typology Code available for payment source | Source: Ambulatory Visit | Attending: Internal Medicine | Admitting: Internal Medicine

## 2020-06-30 ENCOUNTER — Other Ambulatory Visit: Payer: Self-pay

## 2020-06-30 DIAGNOSIS — Z1231 Encounter for screening mammogram for malignant neoplasm of breast: Secondary | ICD-10-CM

## 2020-07-02 ENCOUNTER — Other Ambulatory Visit: Payer: Self-pay

## 2020-07-02 ENCOUNTER — Ambulatory Visit (INDEPENDENT_AMBULATORY_CARE_PROVIDER_SITE_OTHER): Payer: No Typology Code available for payment source | Admitting: Obstetrics & Gynecology

## 2020-07-02 ENCOUNTER — Encounter: Payer: Self-pay | Admitting: Obstetrics & Gynecology

## 2020-07-02 VITALS — BP 126/82 | Ht 62.0 in | Wt 192.0 lb

## 2020-07-02 DIAGNOSIS — Z6835 Body mass index (BMI) 35.0-35.9, adult: Secondary | ICD-10-CM

## 2020-07-02 DIAGNOSIS — Z3042 Encounter for surveillance of injectable contraceptive: Secondary | ICD-10-CM

## 2020-07-02 DIAGNOSIS — Z01419 Encounter for gynecological examination (general) (routine) without abnormal findings: Secondary | ICD-10-CM | POA: Diagnosis not present

## 2020-07-02 DIAGNOSIS — N870 Mild cervical dysplasia: Secondary | ICD-10-CM

## 2020-07-02 DIAGNOSIS — E6609 Other obesity due to excess calories: Secondary | ICD-10-CM

## 2020-07-02 MED ORDER — MEDROXYPROGESTERONE ACETATE 150 MG/ML IM SUSP: 150.0000 mg | INTRAMUSCULAR | 4 refills | Status: DC

## 2020-07-02 NOTE — Progress Notes (Signed)
Ariel Hudson Apr 06, 1971 [id number]   History:    49 y.o. G3P3L3 Married.  Son and daughter in 47's and youngest daughter 2 yo.  RP: Established patient presenting for annual gyn exam   HPI: Well on Depo-Provera for contraception.  No breakthrough bleeding.  No pelvic pain.  No hot flashes or night sweats.  No pain or dryness with intercourse.  Urine and bowel movements normal.  Breasts normal.  Body mass index 35.12.  Walking daily.  Decreasing calories.  Health labs with family physician.  Past medical history,surgical history, family history and social history were all reviewed and documented in the EPIC chart.  Gynecologic History No LMP recorded. Patient has had an injection.  Obstetric History OB History  Gravida Para Term Preterm AB Living  3 3     0 3  SAB TAB Ectopic Multiple Live Births      0        # Outcome Date GA Lbr Len/2nd Weight Sex Delivery Anes PTL Lv  3 Para           2 Para           1 Para              ROS: A ROS was performed and pertinent positives and negatives are included in the history.  GENERAL: No fevers or chills. HEENT: No change in vision, no earache, sore throat or sinus congestion. NECK: No pain or stiffness. CARDIOVASCULAR: No chest pain or pressure. No palpitations. PULMONARY: No shortness of breath, cough or wheeze. GASTROINTESTINAL: No abdominal pain, nausea, vomiting or diarrhea, melena or bright red blood per rectum. GENITOURINARY: No urinary frequency, urgency, hesitancy or dysuria. MUSCULOSKELETAL: No joint or muscle pain, no back pain, no recent trauma. DERMATOLOGIC: No rash, no itching, no lesions. ENDOCRINE: No polyuria, polydipsia, no heat or cold intolerance. No recent change in weight. HEMATOLOGICAL: No anemia or easy bruising or bleeding. NEUROLOGIC: No headache, seizures, numbness, tingling or weakness. PSYCHIATRIC: No depression, no loss of interest in normal activity or change in sleep pattern.     Exam:   BP 126/82    Ht 5\' 2"  (1.575 m)   Wt 192 lb (87.1 kg)   BMI 35.12 kg/m   Body mass index is 35.12 kg/m.  General appearance : Well developed well nourished female. No acute distress HEENT: Eyes: no retinal hemorrhage or exudates,  Neck supple, trachea midline, no carotid bruits, no thyroidmegaly Lungs: Clear to auscultation, no rhonchi or wheezes, or rib retractions  Heart: Regular rate and rhythm, no murmurs or gallops Breast:Examined in sitting and supine position were symmetrical in appearance, no palpable masses or tenderness,  no skin retraction, no nipple inversion, no nipple discharge, no skin discoloration, no axillary or supraclavicular lymphadenopathy Abdomen: no palpable masses or tenderness, no rebound or guarding Extremities: no edema or skin discoloration or tenderness  Pelvic: Vulva: Normal             Vagina: No gross lesions or discharge  Cervix: No gross lesions or discharge.  Pap reflex done.  Uterus AV, normal size, shape and consistency, non-tender and mobile  Adnexa  Without masses or tenderness  Anus: Normal   Assessment/Plan:  49 y.o. female for annual exam   1. Encounter for routine gynecological examination with Papanicolaou smear of cervix Normal gynecologic exam.  Pap reflex done today.  Breast exam normal.  Screening mammogram November 2021, results pending.  Colonoscopy 2020.  Health labs with family physician.  2. Dysplasia of cervix, low grade (CIN 1) H/O CIN 1.  Last Pap ASCUS/HPV HR neg in 2020.  Pap reflex done today.  3. Encounter for surveillance of injectable contraceptive Well on Depo-Provera.  No contraindication to continue.  Represcribed.  4. Class 2 obesity due to excess calories without serious comorbidity with body mass index (BMI) of 35.0 to 35.9 in adult Continue with a lower calorie/carb diet.  Aerobic activities 5 times a week and light weightlifting every 2 days. Other orders - medroxyPROGESTERone (DEPO-PROVERA) 150 MG/ML injection; Inject 1  mL (150 mg total) into the muscle every 3 (three) months.  Genia Del MD, 3:44 PM 07/02/2020

## 2020-07-02 NOTE — Addendum Note (Signed)
Addended by: Berna Spare A on: 07/02/2020 04:00 PM   Modules accepted: Orders

## 2020-07-10 LAB — PAP IG W/ RFLX HPV ASCU

## 2020-07-10 LAB — HUMAN PAPILLOMAVIRUS, HIGH RISK: HPV DNA High Risk: NOT DETECTED

## 2020-09-12 ENCOUNTER — Ambulatory Visit (INDEPENDENT_AMBULATORY_CARE_PROVIDER_SITE_OTHER): Payer: No Typology Code available for payment source | Admitting: *Deleted

## 2020-09-12 ENCOUNTER — Other Ambulatory Visit: Payer: Self-pay

## 2020-09-12 DIAGNOSIS — Z309 Encounter for contraceptive management, unspecified: Secondary | ICD-10-CM | POA: Diagnosis not present

## 2020-09-12 MED ORDER — MEDROXYPROGESTERONE ACETATE 150 MG/ML IM SUSP
150.0000 mg | Freq: Once | INTRAMUSCULAR | Status: AC
  Administered 2020-09-12: 150 mg via INTRAMUSCULAR

## 2020-09-12 NOTE — Progress Notes (Signed)
Patient was here for NV for Depo Provera per Dr. Ardyth Harps. Patient tolerated well.

## 2020-12-08 ENCOUNTER — Ambulatory Visit (INDEPENDENT_AMBULATORY_CARE_PROVIDER_SITE_OTHER): Payer: No Typology Code available for payment source | Admitting: *Deleted

## 2020-12-08 ENCOUNTER — Other Ambulatory Visit: Payer: Self-pay

## 2020-12-08 DIAGNOSIS — Z3042 Encounter for surveillance of injectable contraceptive: Secondary | ICD-10-CM | POA: Diagnosis not present

## 2020-12-08 MED ORDER — MEDROXYPROGESTERONE ACETATE 150 MG/ML IM SUSP
150.0000 mg | Freq: Once | INTRAMUSCULAR | Status: AC
  Administered 2020-12-08: 150 mg via INTRAMUSCULAR

## 2020-12-08 NOTE — Progress Notes (Signed)
Per orders of Dr. Caryl Never, injection of Medroxyprogesterone acetate injectable susp 150mg  given by . Patient tolerated injection well.

## 2021-01-30 ENCOUNTER — Telehealth: Payer: Self-pay | Admitting: Internal Medicine

## 2021-01-30 NOTE — Telephone Encounter (Signed)
Next depo injection due 03/01/21

## 2021-01-30 NOTE — Telephone Encounter (Signed)
Pt is calling in wanting to know when her next Depo Prevo is due and would like to go ahead and schedule.  Pt would like to have a call back.

## 2021-01-30 NOTE — Telephone Encounter (Signed)
Pt was called and scheduled !

## 2021-02-02 ENCOUNTER — Ambulatory Visit: Payer: No Typology Code available for payment source | Admitting: Obstetrics & Gynecology

## 2021-02-02 ENCOUNTER — Other Ambulatory Visit (HOSPITAL_COMMUNITY)
Admission: RE | Admit: 2021-02-02 | Discharge: 2021-02-02 | Disposition: A | Discharge status: Home / Self Care | Payer: No Typology Code available for payment source | Source: Ambulatory Visit | Attending: Obstetrics & Gynecology | Admitting: Obstetrics & Gynecology

## 2021-02-02 ENCOUNTER — Other Ambulatory Visit: Payer: Self-pay

## 2021-02-02 ENCOUNTER — Encounter: Payer: Self-pay | Admitting: Obstetrics & Gynecology

## 2021-02-02 VITALS — BP 122/84 | HR 92 | Resp 16 | Wt 200.0 lb

## 2021-02-02 DIAGNOSIS — N870 Mild cervical dysplasia: Secondary | ICD-10-CM

## 2021-02-02 DIAGNOSIS — R87610 Atypical squamous cells of undetermined significance on cytologic smear of cervix (ASC-US): Secondary | ICD-10-CM

## 2021-02-02 DIAGNOSIS — R87810 Cervical high risk human papillomavirus (HPV) DNA test positive: Secondary | ICD-10-CM

## 2021-02-02 NOTE — Progress Notes (Signed)
    Ariel Hudson August 07, 1971 [id number]        50 y.o.  G3P0003   RP: Pap ASCUS/HPV HR Neg 06/2020  HPI: H/O HPV HR Pos and CIN 1 in 12/2019.   OB History  Gravida Para Term Preterm AB Living  3 3     0 3  SAB IAB Ectopic Multiple Live Births      0        # Outcome Date GA Lbr Len/2nd Weight Sex Delivery Anes PTL Lv  3 Para           2 Para           1 Para             Past medical history,surgical history, problem list, medications, allergies, family history and social history were all reviewed and documented in the EPIC chart.   Directed ROS with pertinent positives and negatives documented in the history of present illness/assessment and plan.  Exam:  Vitals:   02/02/21 1636  BP: 122/84  Pulse: 92  Resp: 16  Weight: 200 lb (90.7 kg)   General appearance:  Normal  Abdomen: Normal  Gynecologic exam: Vulva normal.  Speculum:  Cervix normal.  Pap/HPV HR done.  Vagina normal.  Normal secretions.   Assessment/Plan:  50 y.o. G3P0003   1. ASCUS of cervix with negative high risk HPV Pap test November 2021 showed ASCUS with negative high risk HPV.  Patient had high risk HPV + October 2020, HPV 16-18-45 were negative.  A colposcopy done in May 2021 showed CIN-1.  Pap test with high-risk HPV repeated today.  Management per results. - Cytology - PAP( [place])  2. Dysplasia of cervix, low grade (CIN 1) CIN 1 Colpo 12/2019.  Repeat Pap/HPV HR done today.  3. Cervical high risk HPV (human papillomavirus) test positive  HPV HR pos 05/2019.  HPV 16-18-45 Neg 07/2019.  Genia Del MD, 4:46 PM 02/02/2021

## 2021-02-04 ENCOUNTER — Ambulatory Visit: Payer: No Typology Code available for payment source | Admitting: Family Medicine

## 2021-02-04 ENCOUNTER — Other Ambulatory Visit: Payer: Self-pay

## 2021-02-04 ENCOUNTER — Encounter: Payer: Self-pay | Admitting: Family Medicine

## 2021-02-04 VITALS — BP 120/80 | HR 77 | Temp 97.6°F | Wt 199.4 lb

## 2021-02-04 DIAGNOSIS — K1120 Sialoadenitis, unspecified: Secondary | ICD-10-CM

## 2021-02-04 DIAGNOSIS — H6501 Acute serous otitis media, right ear: Secondary | ICD-10-CM

## 2021-02-04 DIAGNOSIS — H02843 Edema of right eye, unspecified eyelid: Secondary | ICD-10-CM | POA: Diagnosis not present

## 2021-02-04 LAB — CYTOLOGY - PAP
Comment: NEGATIVE
Diagnosis: NEGATIVE
High risk HPV: POSITIVE — AB

## 2021-02-04 MED ORDER — AMOXICILLIN 500 MG PO TABS: 500.0000 mg | ORAL_TABLET | Freq: Two times a day (BID) | ORAL | 0 refills | Status: AC

## 2021-02-04 NOTE — Progress Notes (Signed)
Subjective:    Patient ID: Ariel Hudson, female    DOB: 1971/07/12, 50 y.o.   MRN: [id number]  Chief Complaint  Patient presents with   Facial Swelling    Right, red, itching     HPI Patient was seen today for acute concern.  Patient endorses edema right side of face with and part of right ear x2 days.  Patient noticed bright lower eyelid edema and pruritus yesterday.  Patient tried ice.  Denies tooth pain, jaw pain, fever, chills, facial pain/pressure, ear pain, sore throat, neck pain.  Patient does not recall increased edema face with eating.  States she did not notice symptoms until she put her hand against her face leaning on the counter and felt enlargement.  Patient does note being around increased dust/pollen while in the backyard over the weekend as her husband trimmed bushes.  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  Had unsuccessful procedures in the past to open duct.  Past Medical History:  Diagnosis Date   Abnormal Pap smear of cervix    Allergy    Anemia    as teenager   Anxiety    due to COVID    Gallbladder sludge    dx in 2015 per pt, pt refused surgery   Migraines    Streptococcal sore throat 05/31/2013    No Known Allergies  ROS General: Denies fever, chills, night sweats, changes in weight, changes in appetite HEENT: Denies headaches, ear pain, changes in vision, rhinorrhea, sore throat + right-sided facial edema and right lower eyelid edema CV: Denies CP, palpitations, SOB, orthopnea Pulm: Denies SOB, cough, wheezing GI: Denies abdominal pain, nausea, vomiting, diarrhea, constipation GU: Denies dysuria, hematuria, frequency, vaginal discharge Msk: Denies muscle cramps, joint pains Neuro: Denies weakness, numbness, tingling Skin: Denies rashes, bruising Psych: Denies depression, anxiety, hallucinations     Objective:    Blood pressure 120/80, pulse 77, temperature 97.6 F (36.4 C), temperature source Oral, weight 199 lb 6.4  oz (90.4 kg), SpO2 97 %.  Gen. Pleasant, well-nourished, in no distress, normal affect   HEENT: [place]/AT, right lower eyelid edematous and erythematous with a nevus present.  Conjunctiva clear, no scleral icterus, PERRLA, EOMI, nares patent without drainage, pharynx without erythema or exudate.  Right parotid gland enlargement without TTP.  Right external ear and canal normal without TTP.  Right TM full with cloudy fluid and erythema in upper left.  Left external ear, canal and TM normal.  No cervical, preauricular, or occipital lymphadenopathy.   Lungs: no accessory muscle use Cardiovascular: RRR, no peripheral edema Musculoskeletal: No deformities, no cyanosis or clubbing, normal tone Neuro:  A&Ox3, CN II-XII intact, normal gait Skin:  Warm, no lesions/ rash.  Several nevi on face.  No bites or wounds present on face.   Wt Readings from Last 3 Encounters:  02/04/21 199 lb 6.4 oz (90.4 kg)  02/02/21 200 lb (90.7 kg)  07/02/20 192 lb (87.1 kg)    Lab Results  Component Value Date   WBC 9.9 03/11/2016   HGB 13.3 03/11/2016   HCT 39.7 03/11/2016   PLT 377.0 03/11/2016   GLUCOSE 102 (H) 03/11/2015   CHOL 186 03/23/2018   TRIG 116.0 03/23/2018   HDL 45.80 03/23/2018   LDLCALC 117 (H) 03/23/2018   ALT 16 03/11/2015   AST 15 03/11/2015   NA 141 03/11/2015   K 5.2 (H) 03/11/2015   CL 107 03/11/2015   CREATININE 0.81 03/11/2015   BUN 9 03/11/2015  CO2 27 03/11/2015   TSH 1.31 03/11/2016   HGBA1C 5.5 03/23/2018    Assessment/Plan:  Sialoadenitis -Discussed symptoms likely associated with inflammation, infection, or stone in parotid gland. -Discussed supportive care including compresses, jaw exercises, sour candy -Given handout -Given precautions  Right acute serous otitis media, recurrence not specified -Discussed supportive care including Tylenol if needed for discomfort -Given precautions  - Plan: amoxicillin (AMOXIL) 500 MG tablet  Edema of right eyelid -Likely 2/2  exposure to dust/pollen over the weekend -Okay to take OTC antihistamine such as Claritin, Zyrtec, Allegra for pruritus and inflammation -Discussed supportive care including compresses, keeping skin clean and dry. -Can use a gentle no tears baby shampoo and Q-tip to cleanse eyelid -Continue to monitor  F/u as needed for continued or worsening symptoms  Abbe Amsterdam, MD

## 2021-03-02 ENCOUNTER — Other Ambulatory Visit: Payer: Self-pay

## 2021-03-02 ENCOUNTER — Ambulatory Visit (INDEPENDENT_AMBULATORY_CARE_PROVIDER_SITE_OTHER): Payer: No Typology Code available for payment source

## 2021-03-02 DIAGNOSIS — Z3042 Encounter for surveillance of injectable contraceptive: Secondary | ICD-10-CM

## 2021-03-02 MED ORDER — MEDROXYPROGESTERONE ACETATE 150 MG/ML IM SUSP
150.0000 mg | Freq: Once | INTRAMUSCULAR | Status: AC
  Administered 2021-03-02: 150 mg via INTRAMUSCULAR

## 2021-03-02 NOTE — Progress Notes (Signed)
Depo-Provera 150 mg IM given by: Sonoma Firkus . Patient tolerated injection well. 

## 2021-05-26 ENCOUNTER — Ambulatory Visit (INDEPENDENT_AMBULATORY_CARE_PROVIDER_SITE_OTHER): Payer: No Typology Code available for payment source

## 2021-05-26 ENCOUNTER — Other Ambulatory Visit: Payer: Self-pay

## 2021-05-26 DIAGNOSIS — Z3042 Encounter for surveillance of injectable contraceptive: Secondary | ICD-10-CM

## 2021-05-26 MED ORDER — MEDROXYPROGESTERONE ACETATE 150 MG/ML IM SUSY
150.0000 mg | PREFILLED_SYRINGE | Freq: Once | INTRAMUSCULAR | Status: AC
  Administered 2021-05-26: 150 mg via INTRAMUSCULAR

## 2021-05-26 MED ORDER — MEDROXYPROGESTERONE ACETATE 150 MG/ML IM SUSP: 150.0000 mg | Freq: Once | INTRAMUSCULAR | Status: DC

## 2021-05-26 NOTE — Progress Notes (Signed)
Last Depo 03/02/21 Pt here for Depo Provera injection per Dr Ardyth Harps. Depo Provera 150mg  given IM in left ventrogluteal & pt tolerated well.  Next injection scheduled for 08/14/20.

## 2021-06-02 ENCOUNTER — Other Ambulatory Visit: Payer: Self-pay | Admitting: Internal Medicine

## 2021-06-02 DIAGNOSIS — Z1231 Encounter for screening mammogram for malignant neoplasm of breast: Secondary | ICD-10-CM

## 2021-07-06 ENCOUNTER — Ambulatory Visit
Admission: RE | Admit: 2021-07-06 | Discharge: 2021-07-06 | Disposition: A | Discharge status: Home / Self Care | Payer: No Typology Code available for payment source | Source: Ambulatory Visit | Attending: Internal Medicine | Admitting: Internal Medicine

## 2021-07-06 DIAGNOSIS — Z1231 Encounter for screening mammogram for malignant neoplasm of breast: Secondary | ICD-10-CM

## 2021-08-14 ENCOUNTER — Ambulatory Visit (INDEPENDENT_AMBULATORY_CARE_PROVIDER_SITE_OTHER): Payer: No Typology Code available for payment source

## 2021-08-14 DIAGNOSIS — Z3042 Encounter for surveillance of injectable contraceptive: Secondary | ICD-10-CM

## 2021-08-14 MED ORDER — MEDROXYPROGESTERONE ACETATE 150 MG/ML IM SUSP
150.0000 mg | Freq: Once | INTRAMUSCULAR | Status: AC
  Administered 2021-08-14: 150 mg via INTRAMUSCULAR

## 2021-08-14 NOTE — Progress Notes (Signed)
Ariel Hudson is a 51 y.o. female presents to the office today for DepoProvera injections, per physician's orders. DepoProvera 150mg  was administered IM in right ventrogluteal today. Patient tolerated injection. Patient due for follow up labs/provider appt: No.  Patient next injection due before 11/12/21, appt made Yes  01/12/22   Dr Princess Perna: please cosign since PCP is out of office.

## 2021-10-12 ENCOUNTER — Encounter: Payer: Self-pay | Admitting: Internal Medicine

## 2021-10-12 ENCOUNTER — Ambulatory Visit (INDEPENDENT_AMBULATORY_CARE_PROVIDER_SITE_OTHER): Payer: No Typology Code available for payment source | Admitting: Internal Medicine

## 2021-10-12 VITALS — BP 120/84 | HR 91 | Temp 98.3°F | Ht 63.0 in | Wt 201.4 lb

## 2021-10-12 DIAGNOSIS — R87619 Unspecified abnormal cytological findings in specimens from cervix uteri: Secondary | ICD-10-CM | POA: Diagnosis not present

## 2021-10-12 DIAGNOSIS — Z0000 Encounter for general adult medical examination without abnormal findings: Secondary | ICD-10-CM

## 2021-10-12 LAB — CBC WITH DIFFERENTIAL/PLATELET
Basophils Absolute: 0.1 10*3/uL (ref 0.0–0.1)
Basophils Relative: 1.3 % (ref 0.0–3.0)
Eosinophils Absolute: 0.2 10*3/uL (ref 0.0–0.7)
Eosinophils Relative: 1.7 % (ref 0.0–5.0)
HCT: 42 % (ref 36.0–46.0)
Hemoglobin: 14.1 g/dL (ref 12.0–15.0)
Lymphocytes Relative: 26.4 % (ref 12.0–46.0)
Lymphs Abs: 2.5 10*3/uL (ref 0.7–4.0)
MCHC: 33.5 g/dL (ref 30.0–36.0)
MCV: 93.3 fl (ref 78.0–100.0)
Monocytes Absolute: 0.5 10*3/uL (ref 0.1–1.0)
Monocytes Relative: 5.5 % (ref 3.0–12.0)
Neutro Abs: 6.1 10*3/uL (ref 1.4–7.7)
Neutrophils Relative %: 65.1 % (ref 43.0–77.0)
Platelets: 358 10*3/uL (ref 150.0–400.0)
RBC: 4.5 Mil/uL (ref 3.87–5.11)
RDW: 13.1 % (ref 11.5–15.5)
WBC: 9.3 10*3/uL (ref 4.0–10.5)

## 2021-10-12 LAB — COMPREHENSIVE METABOLIC PANEL
ALT: 47 U/L — ABNORMAL HIGH (ref 0–35)
AST: 33 U/L (ref 0–37)
Albumin: 4.3 g/dL (ref 3.5–5.2)
Alkaline Phosphatase: 104 U/L (ref 39–117)
BUN: 10 mg/dL (ref 6–23)
CO2: 24 mEq/L (ref 19–32)
Calcium: 10.3 mg/dL (ref 8.4–10.5)
Chloride: 102 mEq/L (ref 96–112)
Creatinine, Ser: 0.73 mg/dL (ref 0.40–1.20)
GFR: 94.35 mL/min (ref >60.00)
Glucose, Bld: 88 mg/dL (ref 70–99)
Potassium: 4.3 mEq/L (ref 3.5–5.1)
Sodium: 137 mEq/L (ref 135–145)
Total Bilirubin: 1.5 mg/dL — ABNORMAL HIGH (ref 0.2–1.2)
Total Protein: 7.8 g/dL (ref 6.0–8.3)

## 2021-10-12 LAB — LIPID PANEL
Cholesterol: 193 mg/dL (ref 0–200)
HDL: 51 mg/dL (ref >39.00)
LDL Cholesterol: 117 mg/dL — ABNORMAL HIGH (ref 0–99)
NonHDL: 141.89
Total CHOL/HDL Ratio: 4
Triglycerides: 122 mg/dL (ref 0.0–149.0)
VLDL: 24.4 mg/dL (ref 0.0–40.0)

## 2021-10-12 LAB — HEMOGLOBIN A1C: Hgb A1c MFr Bld: 5.5 % (ref 4.6–6.5)

## 2021-10-12 LAB — TSH: TSH: 1.13 u[IU]/mL (ref 0.35–5.50)

## 2021-10-12 LAB — VITAMIN D 25 HYDROXY (VIT D DEFICIENCY, FRACTURES): VITD: 20.31 ng/mL — ABNORMAL LOW (ref 30.00–100.00)

## 2021-10-12 LAB — VITAMIN B12: Vitamin B-12: 356 pg/mL (ref 211–911)

## 2021-10-12 NOTE — Progress Notes (Signed)
? ? ? ?Established Patient Office Visit ? ? ? ? ?This visit occurred during the SARS-CoV-2 public health emergency.  Safety protocols were in place, including screening questions prior to the visit, additional usage of staff PPE, and extensive cleaning of exam room while observing appropriate contact time as indicated for disinfecting solutions.  ? ? ?CC/Reason for Visit: Annual preventive exam ? ?HPI: Ariel Hudson is a 51 y.o. female who is coming in today for the above mentioned reasons. Past Medical History is significant for: Obesity and a history of abnormal Pap smears.  Her last Pap in December 2021 was negative but positive for high risk HPV.  She is overdue for her repeat Pap smear as of December 2022, have advised her to call GYN.  She is otherwise feeling well, she has routine eye and dental care.  She is overdue for a COVID booster, mammogram and colonoscopy are up-to-date. ? ? ?Past Medical/Surgical History: ?Past Medical History:  ?Diagnosis Date  ? Abnormal Pap smear of cervix   ? Allergy   ? Anemia   ? as teenager  ? Anxiety   ? due to COVID   ? Gallbladder sludge   ? dx in 2015 per pt, pt refused surgery  ? Migraines   ? Streptococcal sore throat 05/31/2013  ? ? ?Past Surgical History:  ?Procedure Laterality Date  ? CESAREAN SECTION  2008  ? ? ?Social History: ? reports that she has never smoked. She has never used smokeless tobacco. She reports that she does not currently use alcohol. She reports that she does not use drugs. ? ?Allergies: ?No Known Allergies ? ?Family History:  ?Family History  ?Problem Relation Age of Onset  ? Breast cancer Other   ?     grandmother  ? Hypertension Father   ? Heart defect Mother   ?     recently admitted to [facility] per patient-needs valve replacement  ? Colon cancer Neg Hx   ? Colon polyps Neg Hx   ? Esophageal cancer Neg Hx   ? Rectal cancer Neg Hx   ? Stomach cancer Neg Hx   ? ? ? ?Current Outpatient Medications:  ?  cholecalciferol (VITAMIN D3) 25 MCG  (1000 UT) tablet, Take 1,000 Units by mouth daily., Disp: , Rfl:  ?  medroxyPROGESTERone (DEPO-PROVERA) 150 MG/ML injection, Inject 1 mL (150 mg total) into the muscle every 3 (three) months., Disp: 1 mL, Rfl: 4 ?  Multiple Vitamin (MULTIVITAMIN) tablet, Take 1 tablet by mouth daily., Disp: , Rfl:  ? ?Review of Systems:  ?Constitutional: Denies fever, chills, diaphoresis, appetite change and fatigue.  ?HEENT: Denies photophobia, eye pain, redness, hearing loss, ear pain, congestion, sore throat, rhinorrhea, sneezing, mouth sores, trouble swallowing, neck pain, neck stiffness and tinnitus.   ?Respiratory: Denies SOB, DOE, cough, chest tightness,  and wheezing.   ?Cardiovascular: Denies chest pain, palpitations and leg swelling.  ?Gastrointestinal: Denies nausea, vomiting, abdominal pain, diarrhea, constipation, blood in stool and abdominal distention.  ?Genitourinary: Denies dysuria, urgency, frequency, hematuria, flank pain and difficulty urinating.  ?Endocrine: Denies: hot or cold intolerance, sweats, changes in hair or nails, polyuria, polydipsia. ?Musculoskeletal: Denies myalgias, back pain, joint swelling, arthralgias and gait problem.  ?Skin: Denies pallor, rash and wound.  ?Neurological: Denies dizziness, seizures, syncope, weakness, light-headedness, numbness and headaches.  ?Hematological: Denies adenopathy. Easy bruising, personal or family bleeding history  ?Psychiatric/Behavioral: Denies suicidal ideation, mood changes, confusion, nervousness, sleep disturbance and agitation ? ? ? ?Physical Exam: ?Vitals:  ? 10/12/21  1254  ?BP: 140/84  ?Pulse: 91  ?Temp: 98.3 ?F (36.8 ?C)  ?TempSrc: Oral  ?SpO2: 97%  ?Weight: 201 lb 6.4 oz (91.4 kg)  ?Height: 5\' 3"  (1.6 m)  ? ? ?Body mass index is 35.68 kg/m?. ? ? ?Constitutional: NAD, calm, comfortable ?Eyes: PERRL, lids and conjunctivae normal ?ENMT: Mucous membranes are moist. Posterior pharynx clear of any exudate or lesions. Normal dentition. Tympanic membrane is  pearly white, no erythema or bulging. ?Neck: normal, supple, no masses, no thyromegaly ?Respiratory: clear to auscultation bilaterally, no wheezing, no crackles. Normal respiratory effort. No accessory muscle use.  ?Cardiovascular: Regular rate and rhythm, no murmurs / rubs / gallops. No extremity edema. 2+ pedal pulses. No carotid bruits.  ?Abdomen: no tenderness, no masses palpated. No hepatosplenomegaly. Bowel sounds positive.  ?Musculoskeletal: no clubbing / cyanosis. No joint deformity upper and lower extremities. Good ROM, no contractures. Normal muscle tone.  ?Skin: no rashes, lesions, ulcers. No induration ?Neurologic: CN 2-12 grossly intact. Sensation intact, DTR normal. Strength 5/5 in all 4.  ?Psychiatric: Normal judgment and insight. Alert and oriented x 3. Normal mood.  ? ? ?Impression and Plan: ? ?Encounter for preventive health examination  ?- Plan: CBC with Differential/Platelet, Comprehensive metabolic panel, Hemoglobin A1c, Lipid panel, TSH, Vitamin B12, VITAMIN D 25 Hydroxy (Vit-D Deficiency, Fractures) ? ?-Recommend routine eye and dental care. ?-Immunizations: She will update her COVID booster, otherwise immunizations are up-to-date ?-Healthy lifestyle discussed in detail. ?-Labs to be updated today. ?-Colon cancer screening: 05/2019 ?-Breast cancer screening: 06/2021 ?-Cervical cancer screening: 07/2020, 1 year follow-up due to high risk HPV, overdue.  Will follow-up with GYN. ?-Lung cancer screening: Not applicable ?-Prostate cancer screening: Not applicable ?-DEXA: Not applicable ? ?Abnormal cervical Papanicolaou smear, unspecified abnormal pap finding ?-She will schedule appointment to follow-up with GYN. ? ? ? ? ?Patient Instructions  ?-Nice seeing you today!! ? ?-Lab work today; will notify you once results are available. ? ?-Remember your COVID booster. ? ?-Remember to schedule follow up with your GYN. ? ?-Schedule follow up with me in 1 year or sooner as needed. ? ? ? ? ? ?08/2020, MD ?[facility] ? ? ?

## 2021-10-12 NOTE — Patient Instructions (Signed)
-  Nice seeing you today!! ? ?-Lab work today; will notify you once results are available. ? ?-Remember your COVID booster. ? ?-Remember to schedule follow up with your GYN. ? ?-Schedule follow up with me in 1 year or sooner as needed. ? ? ?

## 2021-10-13 ENCOUNTER — Encounter: Payer: Self-pay | Admitting: Internal Medicine

## 2021-10-13 ENCOUNTER — Other Ambulatory Visit: Payer: Self-pay | Admitting: Internal Medicine

## 2021-10-13 DIAGNOSIS — E559 Vitamin D deficiency, unspecified: Secondary | ICD-10-CM

## 2021-10-13 DIAGNOSIS — R7401 Elevation of levels of liver transaminase levels: Secondary | ICD-10-CM | POA: Insufficient documentation

## 2021-10-13 MED ORDER — VITAMIN D (ERGOCALCIFEROL) 1.25 MG (50000 UNIT) PO CAPS: 50000.0000 [IU] | ORAL_CAPSULE | ORAL | 0 refills | Status: DC

## 2021-10-13 NOTE — Telephone Encounter (Signed)
Spoke with patient and reviewed lab results. 

## 2021-10-23 ENCOUNTER — Ambulatory Visit (INDEPENDENT_AMBULATORY_CARE_PROVIDER_SITE_OTHER): Payer: No Typology Code available for payment source | Admitting: Obstetrics & Gynecology

## 2021-10-23 ENCOUNTER — Other Ambulatory Visit (HOSPITAL_COMMUNITY)
Admission: RE | Admit: 2021-10-23 | Discharge: 2021-10-23 | Disposition: A | Discharge status: Home / Self Care | Payer: No Typology Code available for payment source | Source: Ambulatory Visit | Attending: Obstetrics & Gynecology | Admitting: Obstetrics & Gynecology

## 2021-10-23 ENCOUNTER — Encounter: Payer: Self-pay | Admitting: Obstetrics & Gynecology

## 2021-10-23 ENCOUNTER — Other Ambulatory Visit: Payer: Self-pay

## 2021-10-23 VITALS — BP 116/80 | HR 70 | Resp 16 | Ht 62.25 in | Wt 200.0 lb

## 2021-10-23 DIAGNOSIS — Z01419 Encounter for gynecological examination (general) (routine) without abnormal findings: Secondary | ICD-10-CM

## 2021-10-23 DIAGNOSIS — N912 Amenorrhea, unspecified: Secondary | ICD-10-CM

## 2021-10-23 DIAGNOSIS — Z3042 Encounter for surveillance of injectable contraceptive: Secondary | ICD-10-CM | POA: Diagnosis not present

## 2021-10-23 DIAGNOSIS — Z6836 Body mass index (BMI) 36.0-36.9, adult: Secondary | ICD-10-CM

## 2021-10-23 DIAGNOSIS — N870 Mild cervical dysplasia: Secondary | ICD-10-CM | POA: Insufficient documentation

## 2021-10-23 DIAGNOSIS — E6609 Other obesity due to excess calories: Secondary | ICD-10-CM

## 2021-10-23 LAB — PREGNANCY, URINE: Preg Test, Ur: NEGATIVE

## 2021-10-23 MED ORDER — MEDROXYPROGESTERONE ACETATE 150 MG/ML IM SUSP
150.0000 mg | INTRAMUSCULAR | 4 refills | Status: DC
Start: 2021-10-23 — End: 2022-12-13

## 2021-10-23 NOTE — Progress Notes (Signed)
? ? ?Ariel Hudson [id number] ? ? ?History:    51 y.o.  G3P3L3 Married.  Son and daughter in 6's and youngest daughter 28 yo. ?  ?RP: Established patient presenting for annual gyn exam  ?  ?HPI: Well on Depo-Provera for contraception, last injection 08/14/2021.  No breakthrough bleeding.  No pelvic pain.  No hot flashes or night sweats.  No pain or dryness with intercourse. CIN 1 in 2021.  Pap 01/2021 Neg, but HPV HR still positive. Feeling bilateral breast tenderness recently, worried she might be pregnant.  No breast lump felt.  Mammo Neg 06/2021. Urine and bowel movements normal.   Body mass index 36.29.  Walking <1 mile daily.  Decreasing calories.  Health labs with family physician. Colono 05/2019. ? ? ?Past medical history,surgical history, family history and social history were all reviewed and documented in the EPIC chart. ? ?Gynecologic History ?No LMP recorded. Patient has had an injection. ? ?Obstetric History ?OB History  ?Gravida Para Term Preterm AB Living  ?3 3     0 3  ?SAB IAB Ectopic Multiple Live Births  ?    0      ?  ?# Outcome Date GA Lbr Len/2nd Weight Sex Delivery Anes PTL Lv  ?3 Para           ?2 Para           ?1 Para           ? ? ? ?ROS: A ROS was performed and pertinent positives and negatives are included in the history. ? GENERAL: No fevers or chills. HEENT: No change in vision, no earache, sore throat or sinus congestion. NECK: No pain or stiffness. CARDIOVASCULAR: No chest pain or pressure. No palpitations. PULMONARY: No shortness of breath, cough or wheeze. GASTROINTESTINAL: No abdominal pain, nausea, vomiting or diarrhea, melena or bright red blood per rectum. GENITOURINARY: No urinary frequency, urgency, hesitancy or dysuria. MUSCULOSKELETAL: No joint or muscle pain, no back pain, no recent trauma. DERMATOLOGIC: No rash, no itching, no lesions. ENDOCRINE: No polyuria, polydipsia, no heat or cold intolerance. No recent change in weight. HEMATOLOGICAL: No anemia or easy  bruising or bleeding. NEUROLOGIC: No headache, seizures, numbness, tingling or weakness. PSYCHIATRIC: No depression, no loss of interest in normal activity or change in sleep pattern.  ?  ? ?Exam: ? ? ?BP 116/80   Pulse 70   Resp 16   Ht 5' 2.25" (1.581 m)   Wt 200 lb (90.7 kg)   BMI 36.29 kg/m?  ? ?Body mass index is 36.29 kg/m?. ? ?General appearance : Well developed well nourished female. No acute distress ?HEENT: Eyes: no retinal hemorrhage or exudates,  Neck supple, trachea midline, no carotid bruits, no thyroidmegaly ?Lungs: Clear to auscultation, no rhonchi or wheezes, or rib retractions  ?Heart: Regular rate and rhythm, no murmurs or gallops ?Breast:Examined in sitting and supine position were symmetrical in appearance, no palpable masses or tenderness,  no skin retraction, no nipple inversion, no nipple discharge, no skin discoloration, no axillary or supraclavicular lymphadenopathy ?Abdomen: no palpable masses or tenderness, no rebound or guarding ?Extremities: no edema or skin discoloration or tenderness ? ?Pelvic: Vulva: Normal ?            Vagina: No gross lesions or discharge ? Cervix: No gross lesions or discharge.  Pap/HPV HR done. ? Uterus  AV, normal size, shape and consistency, non-tender and mobile ? Adnexa  Without masses or tenderness ? Anus: Normal ? ?UPT Neg ? ? ?  Assessment/Plan:  51 y.o. female for annual exam  ? ?1. Encounter for routine gynecological examination with Papanicolaou smear of cervix ?Well on Depo-Provera for contraception, last injection 08/14/2021.  No breakthrough bleeding.  No pelvic pain.  No hot flashes or night sweats.  No pain or dryness with intercourse. CIN 1 in 2021.  Pap 01/2021 Neg, but HPV HR still positive. Feeling bilateral breast tenderness recently, worried she might be pregnant.  No breast lump felt.  Mammo Neg 06/2021. Urine and bowel movements normal.   Body mass index 36.29.  Walking <1 mile daily.  Decreasing calories.  Health labs with family  physician. [facility] 05/2019. ?- Cytology - PAP( [facility]) ? ?2. Dysplasia of cervix, low grade (CIN 1) ?- Cytology - PAP( [facility]) ? ?3. Encounter for surveillance of injectable contraceptive ?Well on Depo-Provera for contraception, last injection 08/14/2021.  No breakthrough bleeding.  No pelvic pain.  No hot flashes or night sweats.  No pain or dryness with intercourse. No CI to continue.  Prescription sent to pharmacy. ? ?4. Amenorrhea ?UPT Neg. ?- Pregnancy, urine ? ?5. Class 2 obesity due to excess calories without serious comorbidity with body mass index (BMI) of 36.0 to 36.9 in adult ?Increase fitness activities.  Lower calorie/carb diet. ? ?Other orders ?- Acetaminophen (TYLENOL PO); Take by mouth. ?- medroxyPROGESTERone (DEPO-PROVERA) 150 MG/ML injection; Inject 1 mL (150 mg total) into the muscle every 3 (three) months.  ? ?[name] MD, 4:13 PM 10/23/2021 ? ?  ?

## 2021-10-27 LAB — CYTOLOGY - PAP
Comment: NEGATIVE
Diagnosis: NEGATIVE
High risk HPV: NEGATIVE

## 2021-11-03 ENCOUNTER — Other Ambulatory Visit: Payer: Self-pay | Admitting: Internal Medicine

## 2021-11-03 DIAGNOSIS — E559 Vitamin D deficiency, unspecified: Secondary | ICD-10-CM

## 2021-11-04 ENCOUNTER — Other Ambulatory Visit: Payer: Self-pay | Admitting: *Deleted

## 2021-11-04 DIAGNOSIS — E559 Vitamin D deficiency, unspecified: Secondary | ICD-10-CM

## 2021-11-04 MED ORDER — VITAMIN D (ERGOCALCIFEROL) 1.25 MG (50000 UNIT) PO CAPS: 50000.0000 [IU] | ORAL_CAPSULE | ORAL | 0 refills | Status: DC

## 2021-11-09 ENCOUNTER — Ambulatory Visit (INDEPENDENT_AMBULATORY_CARE_PROVIDER_SITE_OTHER): Payer: No Typology Code available for payment source

## 2021-11-09 DIAGNOSIS — Z3042 Encounter for surveillance of injectable contraceptive: Secondary | ICD-10-CM | POA: Diagnosis not present

## 2021-11-09 MED ORDER — MEDROXYPROGESTERONE ACETATE 150 MG/ML IM SUSP
150.0000 mg | Freq: Once | INTRAMUSCULAR | Status: AC
  Administered 2021-11-09: 150 mg via INTRAMUSCULAR

## 2021-11-09 NOTE — Progress Notes (Signed)
? ?  Side Effects if any: No ?Depo-Provera 150 mg IM given by: Antion Andres. ?Next appointment due 03/02/2022.  ?

## 2021-12-08 ENCOUNTER — Other Ambulatory Visit: Payer: Self-pay | Admitting: Internal Medicine

## 2021-12-08 ENCOUNTER — Other Ambulatory Visit (INDEPENDENT_AMBULATORY_CARE_PROVIDER_SITE_OTHER): Payer: No Typology Code available for payment source

## 2021-12-08 DIAGNOSIS — E559 Vitamin D deficiency, unspecified: Secondary | ICD-10-CM

## 2021-12-08 DIAGNOSIS — R7401 Elevation of levels of liver transaminase levels: Secondary | ICD-10-CM

## 2021-12-08 LAB — VITAMIN D 25 HYDROXY (VIT D DEFICIENCY, FRACTURES): VITD: 29.11 ng/mL — ABNORMAL LOW (ref 30.00–100.00)

## 2021-12-08 MED ORDER — VITAMIN D (ERGOCALCIFEROL) 1.25 MG (50000 UNIT) PO CAPS: 50000.0000 [IU] | ORAL_CAPSULE | ORAL | 0 refills | Status: DC

## 2021-12-09 ENCOUNTER — Other Ambulatory Visit: Payer: Self-pay | Admitting: *Deleted

## 2021-12-09 DIAGNOSIS — E559 Vitamin D deficiency, unspecified: Secondary | ICD-10-CM

## 2021-12-09 LAB — COMPLETE METABOLIC PANEL WITH GFR
AG Ratio: 1.3 (calc) (ref 1.0–2.5)
ALT: 39 U/L — ABNORMAL HIGH (ref 6–29)
AST: 25 U/L (ref 10–35)
Albumin: 4.2 g/dL (ref 3.6–5.1)
Alkaline phosphatase (APISO): 121 U/L (ref 37–153)
BUN: 9 mg/dL (ref 7–25)
CO2: 25 mmol/L (ref 20–32)
Calcium: 10.5 mg/dL — ABNORMAL HIGH (ref 8.6–10.4)
Chloride: 108 mmol/L (ref 98–110)
Creat: 0.78 mg/dL (ref 0.50–1.03)
Globulin: 3.3 g/dL (calc) (ref 1.9–3.7)
Glucose, Bld: 96 mg/dL (ref 65–99)
Potassium: 5.3 mmol/L (ref 3.5–5.3)
Sodium: 140 mmol/L (ref 135–146)
Total Bilirubin: 1.4 mg/dL — ABNORMAL HIGH (ref 0.2–1.2)
Total Protein: 7.5 g/dL (ref 6.1–8.1)
eGFR: 91 mL/min/{1.73_m2} (ref >=60)

## 2022-01-10 IMAGING — MG DIGITAL SCREENING BILAT W/ CAD
4 series · 4 of 4 positions shown · non-contrast
Comparison: Previous exam(s).

CLINICAL DATA: Screening.

EXAM:
DIGITAL SCREENING BILATERAL MAMMOGRAM WITH CAD

[L CC]
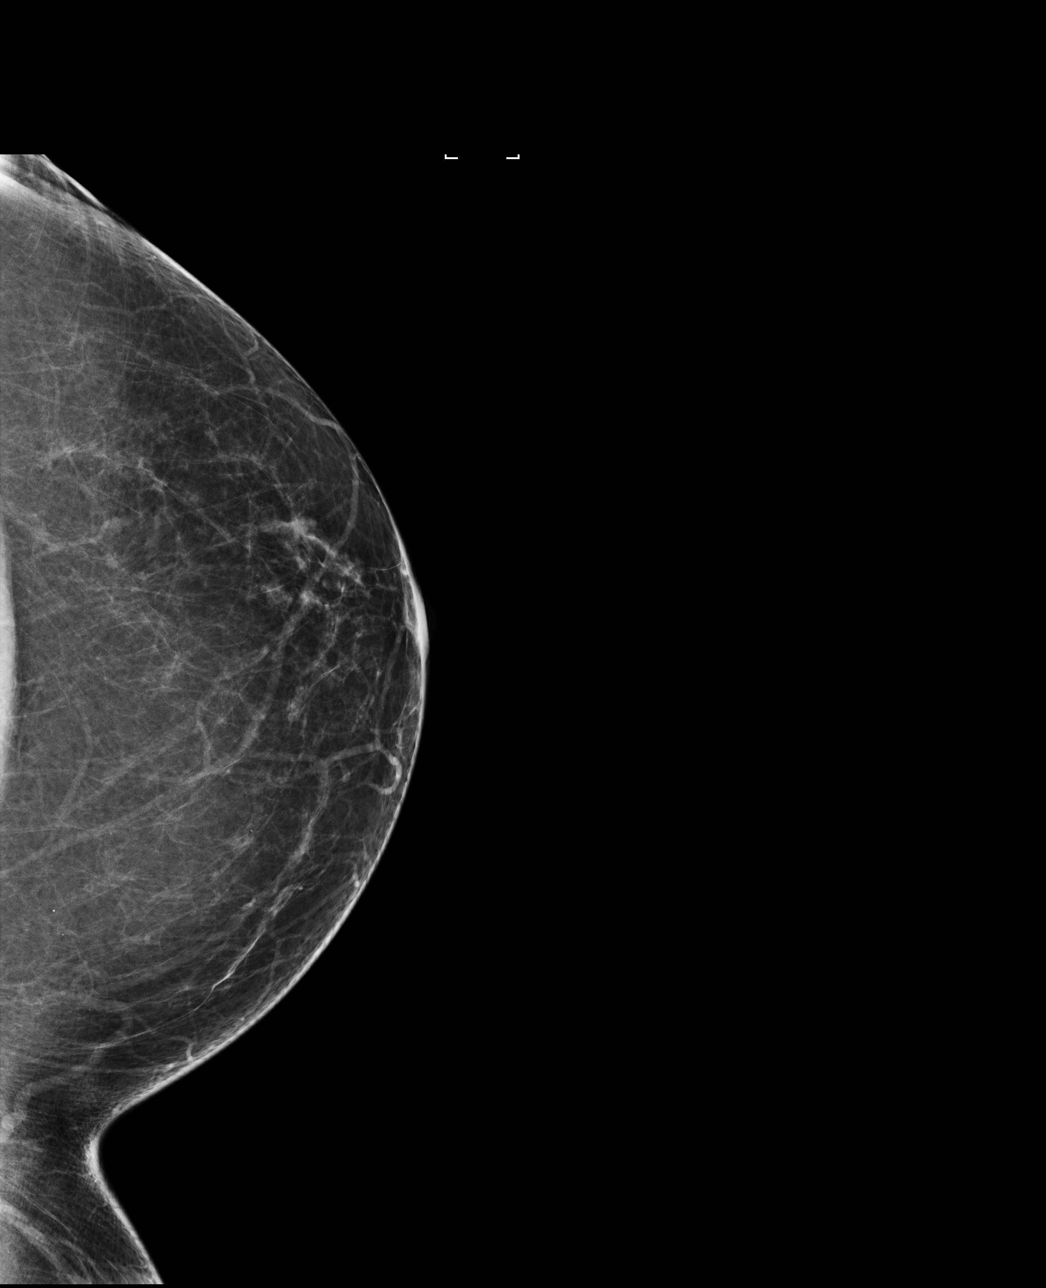

[R CC]
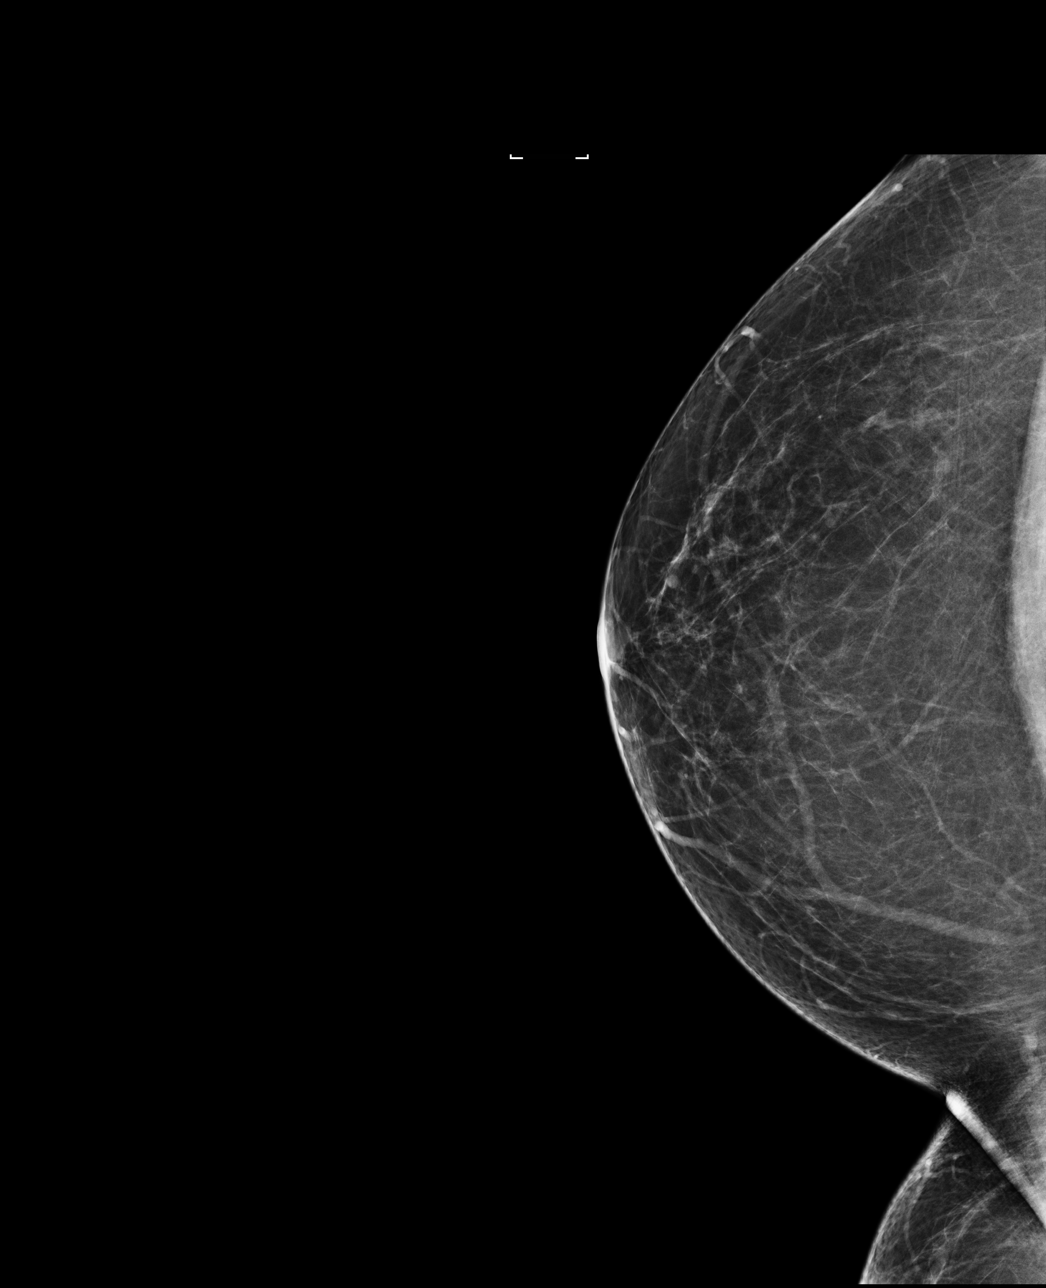

[R MLO]
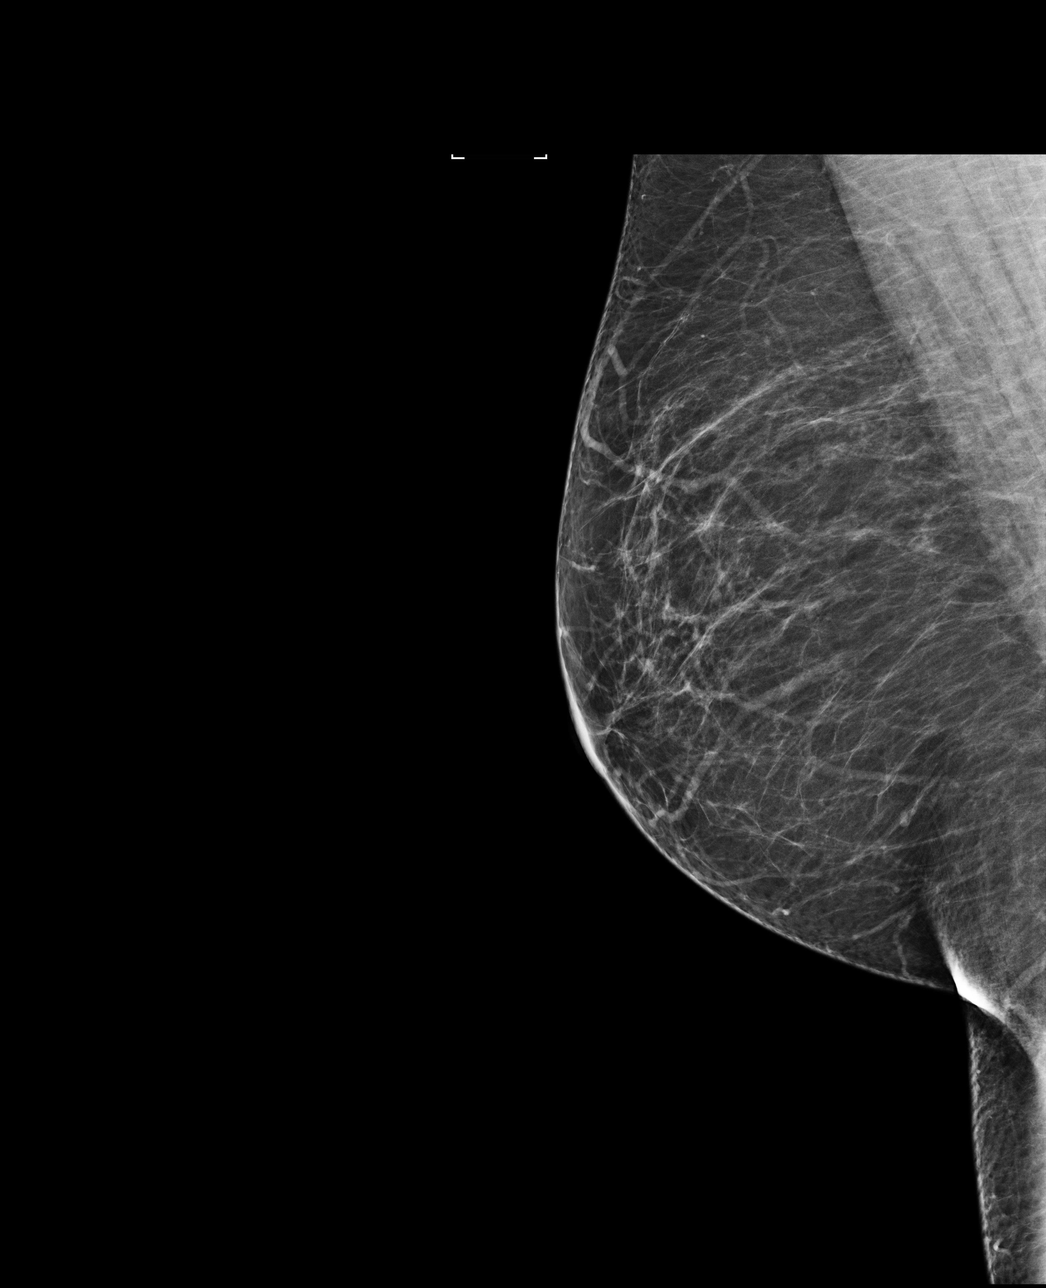

[L MLO]
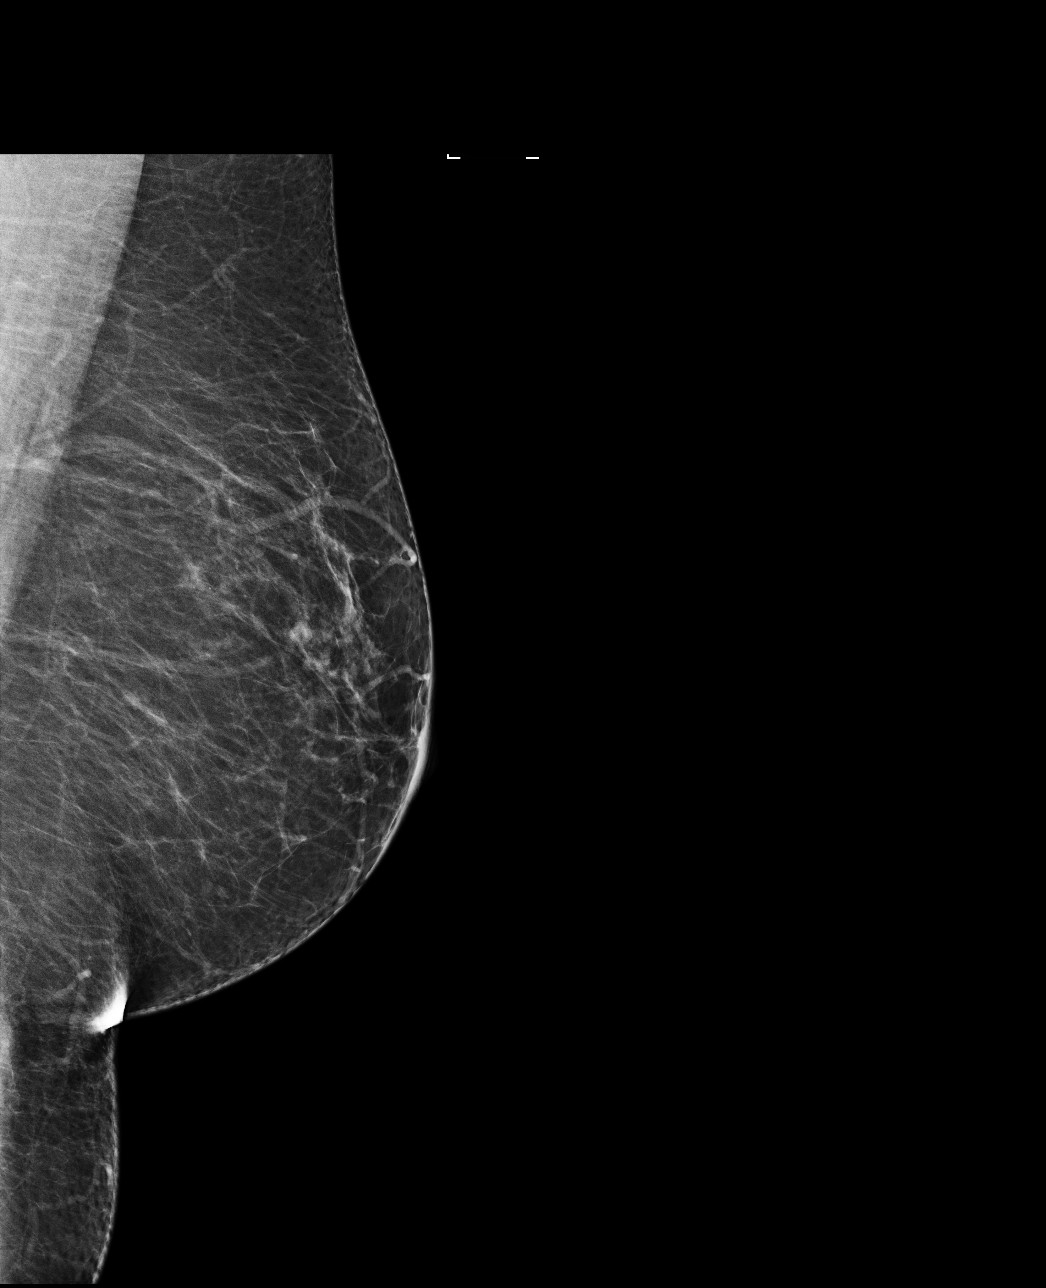

[4 of 4 positions shown; findings below may reference images not displayed]

ACR Breast Density Category b: There are scattered areas of
fibroglandular density.
FINDINGS: There are no findings suspicious for malignancy. Images were
processed with CAD.
IMPRESSION: No mammographic evidence of malignancy. A result letter of this
screening mammogram will be mailed directly to the patient.

RECOMMENDATION:
Screening mammogram in one year. (Code:AS-G-LCT)

BI-RADS CATEGORY  1: Negative.

## 2022-01-12 ENCOUNTER — Other Ambulatory Visit: Payer: Self-pay | Admitting: Internal Medicine

## 2022-01-12 ENCOUNTER — Other Ambulatory Visit: Payer: No Typology Code available for payment source

## 2022-01-12 DIAGNOSIS — E559 Vitamin D deficiency, unspecified: Secondary | ICD-10-CM

## 2022-01-12 MED ORDER — VITAMIN D (ERGOCALCIFEROL) 1.25 MG (50000 UNIT) PO CAPS: 50000.0000 [IU] | ORAL_CAPSULE | ORAL | 0 refills | Status: AC

## 2022-01-29 ENCOUNTER — Ambulatory Visit (INDEPENDENT_AMBULATORY_CARE_PROVIDER_SITE_OTHER): Payer: No Typology Code available for payment source

## 2022-01-29 DIAGNOSIS — Z3042 Encounter for surveillance of injectable contraceptive: Secondary | ICD-10-CM

## 2022-01-29 MED ORDER — MEDROXYPROGESTERONE ACETATE 150 MG/ML IM SUSP
150.0000 mg | Freq: Once | INTRAMUSCULAR | Status: AC
  Administered 2022-01-29: 150 mg via INTRAMUSCULAR

## 2022-04-14 ENCOUNTER — Other Ambulatory Visit (INDEPENDENT_AMBULATORY_CARE_PROVIDER_SITE_OTHER): Payer: No Typology Code available for payment source

## 2022-04-14 DIAGNOSIS — E559 Vitamin D deficiency, unspecified: Secondary | ICD-10-CM | POA: Diagnosis not present

## 2022-04-14 LAB — VITAMIN D 25 HYDROXY (VIT D DEFICIENCY, FRACTURES): VITD: 36.09 ng/mL (ref 30.00–100.00)

## 2022-04-22 ENCOUNTER — Ambulatory Visit (INDEPENDENT_AMBULATORY_CARE_PROVIDER_SITE_OTHER): Payer: No Typology Code available for payment source

## 2022-04-22 DIAGNOSIS — Z3042 Encounter for surveillance of injectable contraceptive: Secondary | ICD-10-CM

## 2022-04-22 MED ORDER — MEDROXYPROGESTERONE ACETATE 150 MG/ML IM SUSP
150.0000 mg | Freq: Once | INTRAMUSCULAR | Status: AC
  Administered 2022-04-22: 150 mg via INTRAMUSCULAR

## 2022-04-22 NOTE — Progress Notes (Signed)
Per orders of Dr. Ardyth Harps, injection of Medroxyprogesterone Acetate 150 mg/ml given by Avneet Ashmore L Anessa Charley. Patient tolerated injection well.

## 2022-05-25 ENCOUNTER — Other Ambulatory Visit: Payer: Self-pay | Admitting: Internal Medicine

## 2022-05-25 DIAGNOSIS — Z1231 Encounter for screening mammogram for malignant neoplasm of breast: Secondary | ICD-10-CM

## 2022-07-09 ENCOUNTER — Ambulatory Visit
Admission: RE | Admit: 2022-07-09 | Discharge: 2022-07-09 | Disposition: A | Discharge status: Home / Self Care | Payer: No Typology Code available for payment source | Source: Ambulatory Visit | Attending: Internal Medicine | Admitting: Internal Medicine

## 2022-07-09 DIAGNOSIS — Z1231 Encounter for screening mammogram for malignant neoplasm of breast: Secondary | ICD-10-CM

## 2022-07-21 ENCOUNTER — Ambulatory Visit (INDEPENDENT_AMBULATORY_CARE_PROVIDER_SITE_OTHER): Payer: No Typology Code available for payment source | Admitting: *Deleted

## 2022-07-21 DIAGNOSIS — Z3042 Encounter for surveillance of injectable contraceptive: Secondary | ICD-10-CM

## 2022-07-21 MED ORDER — MEDROXYPROGESTERONE ACETATE 150 MG/ML IM SUSP
150.0000 mg | Freq: Once | INTRAMUSCULAR | Status: AC
  Administered 2022-07-21: 150 mg via INTRAMUSCULAR

## 2022-07-21 NOTE — Progress Notes (Signed)
Per orders of Dr. Ardyth Harps, injection of Medroxyprogesterone Acetate 150mg  given by . Patient tolerated injection well.

## 2022-10-11 ENCOUNTER — Ambulatory Visit (INDEPENDENT_AMBULATORY_CARE_PROVIDER_SITE_OTHER): Payer: No Typology Code available for payment source

## 2022-10-11 DIAGNOSIS — Z3042 Encounter for surveillance of injectable contraceptive: Secondary | ICD-10-CM

## 2022-10-11 MED ORDER — MEDROXYPROGESTERONE ACETATE 150 MG/ML IM SUSY
150.0000 mg | PREFILLED_SYRINGE | Freq: Once | INTRAMUSCULAR | Status: AC
  Administered 2022-10-11: 150 mg via INTRAMUSCULAR

## 2022-10-11 NOTE — Progress Notes (Signed)
Per orders of Dr. Jerilee Hoh , injection of MedroxyPROGESTERone Acetate inj. '150mg'$ /ml  given by Encarnacion Slates on Right Ventrogluteal. Patient tolerated injection well.

## 2022-11-29 ENCOUNTER — Encounter: Payer: Self-pay | Admitting: Internal Medicine

## 2022-11-30 ENCOUNTER — Encounter: Payer: Self-pay | Admitting: Internal Medicine

## 2022-11-30 ENCOUNTER — Ambulatory Visit: Payer: No Typology Code available for payment source | Admitting: Internal Medicine

## 2022-11-30 VITALS — BP 120/84 | HR 88 | Temp 99.3°F | Wt 199.2 lb

## 2022-11-30 DIAGNOSIS — R1011 Right upper quadrant pain: Secondary | ICD-10-CM

## 2022-11-30 NOTE — Progress Notes (Signed)
Established Patient Office Visit     CC/Reason for Visit: Abdominal pain  HPI: Ariel Hudson is a 52 y.o. female who is coming in today for the above mentioned reasons.  6 years ago she has severe right upper quadrant pain and a month later had a recurrent episode.  She saw a GI physician who told her she had gallbladder sludge on ultrasound.  She was advised to have surgery but she declined.  In the last 6 years she has had about 5 episodes.  However in the last week she has had an episode every other day.  Last night she had a severe episode with bloating, she describes chills although no fever, she had nausea and no vomiting.  Food definitely makes it worse.   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 She has never used smokeless tobacco. She reports that she does not currently use alcohol. She reports that she does not use drugs.  Allergies: No Known Allergies  Family History:  Family History  Problem Relation Age of Onset   Heart defect Mother        recently admitted to [facility] per patient-needs valve replacement   Hypertension Father    Breast cancer Paternal Grandmother    Colon cancer Neg Hx    Colon polyps Neg Hx    Esophageal cancer Neg Hx    Rectal cancer Neg Hx    Stomach cancer Neg Hx      Current Outpatient Medications:    Acetaminophen (TYLENOL PO), Take by mouth., Disp: , Rfl:    medroxyPROGESTERone (DEPO-PROVERA) 150 MG/ML injection, Inject 1 mL (150 mg total) into the muscle every 3 (three) months., Disp: 1 mL, Rfl: 4   Multiple Vitamin (MULTIVITAMIN) tablet, Take 1  tablet by mouth daily., Disp: , Rfl:   Review of Systems:  Negative unless indicated in HPI.   Physical Exam: Vitals:   11/30/22 1451  BP: 120/84  Pulse: 88  Temp: 99.3 F (37.4 C)  TempSrc: Oral  SpO2: 99%  Weight: 199 lb 3.2 oz (90.4 kg)    Body mass index is 36.14 kg/m.   Physical Exam Vitals reviewed.  Constitutional:      Appearance: Normal appearance.  HENT:     Head: Normocephalic and atraumatic.  Eyes:     Conjunctiva/sclera: Conjunctivae normal.     Pupils: Pupils are equal, round, and reactive to light.  Abdominal:     General: Abdomen is flat.     Tenderness: There is abdominal tenderness in the right upper quadrant.  Skin:    General: Skin is warm and dry.  Neurological:     General: No focal deficit present.     Mental Status: She is alert and oriented to person, place, and time.  Psychiatric:        Mood and Affect: Mood normal.        Behavior: Behavior normal.        Thought Content: Thought content normal.  Judgment: Judgment normal.      Impression and Plan:  RUQ pain - Plan: CBC with Differential/Platelet, Comprehensive metabolic panel, US ABDOMEN LIMITED RUQ (LIVER/GB)  -Concerning for gallbladder colic. -Check CBC and CMP. -Check right upper quadrant ultrasound.  Further workup pending results.  Suspect she will end up with a cholecystectomy.  Time spent:32 minutes reviewing chart, interviewing and examining patient and formulating plan of care.     Chaya Jan, MD [facility] at [facility]

## 2022-12-01 ENCOUNTER — Telehealth: Payer: Self-pay | Admitting: Gastroenterology

## 2022-12-01 ENCOUNTER — Other Ambulatory Visit: Payer: Self-pay | Admitting: *Deleted

## 2022-12-01 DIAGNOSIS — R1013 Epigastric pain: Secondary | ICD-10-CM

## 2022-12-01 DIAGNOSIS — R7989 Other specified abnormal findings of blood chemistry: Secondary | ICD-10-CM

## 2022-12-01 DIAGNOSIS — R9389 Abnormal findings on diagnostic imaging of other specified body structures: Secondary | ICD-10-CM

## 2022-12-01 DIAGNOSIS — R935 Abnormal findings on diagnostic imaging of other abdominal regions, including retroperitoneum: Secondary | ICD-10-CM

## 2022-12-01 LAB — CBC WITH DIFFERENTIAL/PLATELET
Basophils Absolute: 0 10*3/uL (ref 0.0–0.1)
Basophils Relative: 0.7 % (ref 0.0–3.0)
Eosinophils Absolute: 0.2 10*3/uL (ref 0.0–0.7)
Eosinophils Relative: 2.5 % (ref 0.0–5.0)
HCT: 44.1 % (ref 36.0–46.0)
Hemoglobin: 15 g/dL (ref 12.0–15.0)
Lymphocytes Relative: 24.7 % (ref 12.0–46.0)
Lymphs Abs: 1.8 10*3/uL (ref 0.7–4.0)
MCHC: 33.9 g/dL (ref 30.0–36.0)
MCV: 93.8 fl (ref 78.0–100.0)
Monocytes Absolute: 0.3 10*3/uL (ref 0.1–1.0)
Monocytes Relative: 4.7 % (ref 3.0–12.0)
Neutro Abs: 4.9 10*3/uL (ref 1.4–7.7)
Neutrophils Relative %: 67.4 % (ref 43.0–77.0)
Platelets: 353 10*3/uL (ref 150.0–400.0)
RBC: 4.71 Mil/uL (ref 3.87–5.11)
RDW: 13.4 % (ref 11.5–15.5)
WBC: 7.2 10*3/uL (ref 4.0–10.5)

## 2022-12-01 LAB — COMPREHENSIVE METABOLIC PANEL
ALT: 636 U/L — ABNORMAL HIGH (ref 0–35)
AST: 369 U/L — ABNORMAL HIGH (ref 0–37)
Albumin: 4.3 g/dL (ref 3.5–5.2)
Alkaline Phosphatase: 192 U/L — ABNORMAL HIGH (ref 39–117)
BUN: 9 mg/dL (ref 6–23)
CO2: 24 mEq/L (ref 19–32)
Calcium: 10.5 mg/dL (ref 8.4–10.5)
Chloride: 104 mEq/L (ref 96–112)
Creatinine, Ser: 0.71 mg/dL (ref 0.40–1.20)
GFR: 96.77 mL/min (ref >60.00)
Glucose, Bld: 89 mg/dL (ref 70–99)
Potassium: 4.1 mEq/L (ref 3.5–5.1)
Sodium: 138 mEq/L (ref 135–145)
Total Bilirubin: 1.8 mg/dL — ABNORMAL HIGH (ref 0.2–1.2)
Total Protein: 8 g/dL (ref 6.0–8.3)

## 2022-12-01 NOTE — Telephone Encounter (Signed)
Ariel Hudson,  Urgent referral in [place] from PCP.  Abnormal LFT's and epigastric pain.  Possible ERCP?  Last saw Dr. Lavon Paganini in 2020.  Please review and advise scheduling.  Thanks

## 2022-12-02 ENCOUNTER — Ambulatory Visit (HOSPITAL_BASED_OUTPATIENT_CLINIC_OR_DEPARTMENT_OTHER)
Admission: RE | Admit: 2022-12-02 | Discharge: 2022-12-02 | Disposition: A | Discharge status: Home / Self Care | Payer: No Typology Code available for payment source | Source: Ambulatory Visit | Attending: Internal Medicine | Admitting: Internal Medicine

## 2022-12-02 ENCOUNTER — Other Ambulatory Visit (INDEPENDENT_AMBULATORY_CARE_PROVIDER_SITE_OTHER): Payer: No Typology Code available for payment source

## 2022-12-02 ENCOUNTER — Other Ambulatory Visit: Payer: Self-pay | Admitting: Internal Medicine

## 2022-12-02 DIAGNOSIS — R7401 Elevation of levels of liver transaminase levels: Secondary | ICD-10-CM

## 2022-12-02 DIAGNOSIS — R1011 Right upper quadrant pain: Secondary | ICD-10-CM | POA: Insufficient documentation

## 2022-12-02 LAB — COMPREHENSIVE METABOLIC PANEL
ALT: 415 U/L — ABNORMAL HIGH (ref 0–35)
AST: 126 U/L — ABNORMAL HIGH (ref 0–37)
Albumin: 4.4 g/dL (ref 3.5–5.2)
Alkaline Phosphatase: 183 U/L — ABNORMAL HIGH (ref 39–117)
BUN: 9 mg/dL (ref 6–23)
CO2: 26 mEq/L (ref 19–32)
Calcium: 11.2 mg/dL — ABNORMAL HIGH (ref 8.4–10.5)
Chloride: 104 mEq/L (ref 96–112)
Creatinine, Ser: 0.75 mg/dL (ref 0.40–1.20)
GFR: 90.61 mL/min (ref >60.00)
Glucose, Bld: 87 mg/dL (ref 70–99)
Potassium: 4.1 mEq/L (ref 3.5–5.1)
Sodium: 138 mEq/L (ref 135–145)
Total Bilirubin: 1.6 mg/dL — ABNORMAL HIGH (ref 0.2–1.2)
Total Protein: 8.4 g/dL — ABNORMAL HIGH (ref 6.0–8.3)

## 2022-12-02 LAB — CBC WITH DIFFERENTIAL/PLATELET
Basophils Absolute: 0.1 10*3/uL (ref 0.0–0.1)
Basophils Relative: 1.1 % (ref 0.0–3.0)
Eosinophils Absolute: 0.1 10*3/uL (ref 0.0–0.7)
Eosinophils Relative: 1.6 % (ref 0.0–5.0)
HCT: 44.1 % (ref 36.0–46.0)
Hemoglobin: 14.9 g/dL (ref 12.0–15.0)
Lymphocytes Relative: 26.3 % (ref 12.0–46.0)
Lymphs Abs: 2 10*3/uL (ref 0.7–4.0)
MCHC: 33.7 g/dL (ref 30.0–36.0)
MCV: 94 fl (ref 78.0–100.0)
Monocytes Absolute: 0.5 10*3/uL (ref 0.1–1.0)
Monocytes Relative: 6.9 % (ref 3.0–12.0)
Neutro Abs: 4.9 10*3/uL (ref 1.4–7.7)
Neutrophils Relative %: 64.1 % (ref 43.0–77.0)
Platelets: 368 10*3/uL (ref 150.0–400.0)
RBC: 4.69 Mil/uL (ref 3.87–5.11)
RDW: 13.2 % (ref 11.5–15.5)
WBC: 7.6 10*3/uL (ref 4.0–10.5)

## 2022-12-02 NOTE — Telephone Encounter (Signed)
I just put orders in for MRCP but I have not scheduled yet

## 2022-12-02 NOTE — Telephone Encounter (Signed)
Beth, please schedule MRCP to exclude choledocholithiasis and urgent visit with APP or me. Thanks

## 2022-12-02 NOTE — Telephone Encounter (Signed)
Thank you :)

## 2022-12-02 NOTE — Telephone Encounter (Addendum)
You have been scheduled for an MRI/MRCP at_[facility] on 12/03/2022 Friday at 7:00 pm arrive at 6:30 pm  Please go through the Emergency Room Entrance as outpatient, you will not have to wait just go through the ER entrance because the MRI entrance will be closed.   Please make certain not to have anything to eat or drink 4 hours prior to your test. In addition, if you have any metal in your body, have a pacemaker or defibrillator, please be sure to let your ordering physician know. This test typically takes 45 minutes to 1 hour to complete. Should you need to reschedule, please call [phone] to do so.   Spoke with patient and told her about her test at [facility] and her appointment work in with Dr Lavon Paganini is on 12/15/2022 at 9:10 am.   She will call back with any questions but she understands everything she needs to do. She actually said she feels better today but has had to watch what she eats. She was in horrible pain for the past two weeks.     FYI Dr Lavon Paganini

## 2022-12-03 ENCOUNTER — Ambulatory Visit (HOSPITAL_COMMUNITY)
Admission: RE | Admit: 2022-12-03 | Discharge: 2022-12-03 | Disposition: A | Discharge status: Home / Self Care | Payer: No Typology Code available for payment source | Source: Ambulatory Visit | Attending: Gastroenterology | Admitting: Gastroenterology

## 2022-12-03 ENCOUNTER — Other Ambulatory Visit: Payer: Self-pay | Admitting: Gastroenterology

## 2022-12-03 DIAGNOSIS — R9389 Abnormal findings on diagnostic imaging of other specified body structures: Secondary | ICD-10-CM

## 2022-12-03 DIAGNOSIS — R1013 Epigastric pain: Secondary | ICD-10-CM

## 2022-12-03 DIAGNOSIS — R7989 Other specified abnormal findings of blood chemistry: Secondary | ICD-10-CM | POA: Insufficient documentation

## 2022-12-03 LAB — HEPATITIS PANEL, ACUTE
Hep A IgM: NONREACTIVE
Hep B C IgM: NONREACTIVE
Hepatitis B Surface Ag: NONREACTIVE
Hepatitis C Ab: NONREACTIVE

## 2022-12-03 MED ORDER — GADOBUTROL 1 MMOL/ML IV SOLN
9.0000 mL | Freq: Once | INTRAVENOUS | Status: AC | PRN
  Administered 2022-12-03: 9 mL via INTRAVENOUS

## 2022-12-09 ENCOUNTER — Telehealth: Payer: Self-pay | Admitting: Gastroenterology

## 2022-12-09 NOTE — Telephone Encounter (Signed)
Patient called stating she would like to speak about her recent MRI results. She was advised that she may not need the May 8th appointment due to being referred elsewhere. She would like to speak to make sure the appointment is not needed so she has time to cancel. Please advise, thank you.

## 2022-12-10 NOTE — Telephone Encounter (Signed)
Spoke with the patient. Canceled her appointment 12/15/22. She will call us when she is recovered from her surgery. We will schedule her follow up with Dr Lavon Paganini at that time.  Patient called [facility] yesterday and left the referral coordinator a voicemail. She is waiting to schedule her consultation appointment.

## 2022-12-13 ENCOUNTER — Ambulatory Visit (INDEPENDENT_AMBULATORY_CARE_PROVIDER_SITE_OTHER): Payer: No Typology Code available for payment source | Admitting: Obstetrics & Gynecology

## 2022-12-13 ENCOUNTER — Encounter: Payer: Self-pay | Admitting: Obstetrics & Gynecology

## 2022-12-13 VITALS — BP 122/78 | HR 94 | Ht 63.0 in | Wt 198.0 lb

## 2022-12-13 DIAGNOSIS — Z01419 Encounter for gynecological examination (general) (routine) without abnormal findings: Secondary | ICD-10-CM

## 2022-12-13 DIAGNOSIS — Z3042 Encounter for surveillance of injectable contraceptive: Secondary | ICD-10-CM | POA: Diagnosis not present

## 2022-12-13 MED ORDER — MEDROXYPROGESTERONE ACETATE 150 MG/ML IM SUSP: 150.0000 mg | INTRAMUSCULAR | 4 refills | Status: AC

## 2022-12-13 NOTE — Progress Notes (Signed)
Ariel Hudson [birth year] [id number]   History:    52 y.o.  G3P3L3 Married.  Son and daughter in 66's and youngest daughter 86 yo.   RP: Established patient presenting for annual gyn exam    HPI: Well on Depo-Provera for contraception, last injection 10/11/2022.  No breakthrough bleeding.  No pelvic pain.  Starting to experience hot flashes or night sweats, mood swings, and decreased libido.  No pain or dryness with intercourse. CIN 1 in 2021. Pap Neg/HPV HR Neg in 10/2021.  Repeat Pap at 2-3 years.  Breasts normal.  Mammo Neg 07/2022. Urine and bowel movements normal.   Body mass index 35.07. Planning to increase walking.  Decreasing calories.  Health labs with family physician. Colono normal in 05/2019.  Cholecystectomy scheduled.   Past medical history,surgical history, family history and social history were all reviewed and documented in the EPIC chart.  Gynecologic History No LMP recorded. Patient has had an injection.  Obstetric History OB History  Gravida Para Term Preterm AB Living  3 3     0 3  SAB IAB Ectopic Multiple Live Births      0        # Outcome Date GA Lbr Len/2nd Weight Sex Delivery Anes PTL Lv  3 Para           2 Para           1 Para              ROS: A ROS was performed and pertinent positives and negatives are included in the history. GENERAL: No fevers or chills. HEENT: No change in vision, no earache, sore throat or sinus congestion. NECK: No pain or stiffness. CARDIOVASCULAR: No chest pain or pressure. No palpitations. PULMONARY: No shortness of breath, cough or wheeze. GASTROINTESTINAL: No abdominal pain, nausea, vomiting or diarrhea, melena or bright red blood per rectum. GENITOURINARY: No urinary frequency, urgency, hesitancy or dysuria. MUSCULOSKELETAL: No joint or muscle pain, no back pain, no recent trauma. DERMATOLOGIC: No rash, no itching, no lesions. ENDOCRINE: No polyuria, polydipsia, no heat or cold intolerance. No recent change in weight.  HEMATOLOGICAL: No anemia or easy bruising or bleeding. NEUROLOGIC: No headache, seizures, numbness, tingling or weakness. PSYCHIATRIC: No depression, no loss of interest in normal activity or change in sleep pattern.     Exam:   BP 122/78   Pulse 94   Ht 5\' 3"  (1.6 m)   Wt 198 lb (89.8 kg)   SpO2 98%   BMI 35.07 kg/m   Body mass index is 35.07 kg/m.  General appearance : Well developed well nourished female. No acute distress HEENT: Eyes: no retinal hemorrhage or exudates,  Neck supple, trachea midline, no carotid bruits, no thyroidmegaly Lungs: Clear to auscultation, no rhonchi or wheezes, or rib retractions  Heart: Regular rate and rhythm, no murmurs or gallops Breast:Examined in sitting and supine position were symmetrical in appearance, no palpable masses or tenderness,  no skin retraction, no nipple inversion, no nipple discharge, no skin discoloration, no axillary or supraclavicular lymphadenopathy Abdomen: no palpable masses or tenderness, no rebound or guarding Extremities: no edema or skin discoloration or tenderness  Pelvic: Vulva: Normal             Vagina: No gross lesions or discharge  Cervix: No gross lesions or discharge  Uterus  AV, normal size, shape and consistency, non-tender and mobile  Adnexa  Without masses or tenderness  Anus: Normal   Assessment/Plan:  52 y.o. female  for annual exam   1. Well female exam with routine gynecological exam Well on Depo-Provera for contraception, last injection 10/11/2022. No breakthrough bleeding.  No pelvic pain.  Starting to experience hot flashes or night sweats, mood swings, and decreased libido.  No pain or dryness with intercourse. CIN 1 in 2021. Pap Neg/HPV HR Neg in 10/2021.  Repeat Pap at 2-3 years.  Breasts normal.  Mammo Neg 07/2022. Urine and bowel movements normal.   Body mass index 35.07. Planning to increase walking.  Decreasing calories.  Health labs with family physician. Colono normal in 05/2019.  Cholecystectomy  scheduled.  2. Encounter for surveillance of injectable contraceptive Well on Depo-Provera for contraception, last injection 10/11/2022. No breakthrough bleeding.  No pelvic pain.  Starting to experience hot flashes or night sweats, mood swings, and decreased libido.  No pain or dryness with intercourse. Probably getting in perimenopause.  Recommend continuing on Depo-Provera at this time, no CI.  Prescription sent to pharmacy.  Other orders - MILK THISTLE PO; Take by mouth. - medroxyPROGESTERone (DEPO-PROVERA) 150 MG/ML injection; Inject 1 mL (150 mg total) into the muscle every 3 (three) months.   Genia Del MD, 3:23 PM

## 2022-12-15 ENCOUNTER — Ambulatory Visit: Payer: No Typology Code available for payment source | Admitting: Gastroenterology

## 2022-12-24 ENCOUNTER — Other Ambulatory Visit: Payer: Self-pay | Admitting: General Surgery

## 2022-12-30 ENCOUNTER — Ambulatory Visit: Payer: No Typology Code available for payment source | Admitting: Internal Medicine

## 2022-12-30 ENCOUNTER — Encounter: Payer: Self-pay | Admitting: Internal Medicine

## 2022-12-30 VITALS — BP 110/80 | HR 73 | Temp 98.5°F | Wt 197.5 lb

## 2022-12-30 DIAGNOSIS — K8020 Calculus of gallbladder without cholecystitis without obstruction: Secondary | ICD-10-CM | POA: Diagnosis not present

## 2022-12-30 DIAGNOSIS — Z3042 Encounter for surveillance of injectable contraceptive: Secondary | ICD-10-CM

## 2022-12-30 MED ORDER — MEDROXYPROGESTERONE ACETATE 150 MG/ML IM SUSY
150.0000 mg | PREFILLED_SYRINGE | Freq: Once | INTRAMUSCULAR | Status: AC
Start: 2022-12-30 — End: 2022-12-30
  Administered 2022-12-30: 150 mg via INTRAMUSCULAR

## 2022-12-30 NOTE — Progress Notes (Signed)
Established Patient Office Visit     CC/Reason for Visit: Depo-Provera shot, follow-up cholecystitis  HPI: Ariel Hudson is a 52 y.o. female who is coming in today for the above mentioned reasons.  She is due for her 58-month medroxyprogesterone.  I saw her last for right upper quadrant pain, she had an ultrasound with cholelithiasis and elevated LFTs.  She had an MRCP that did not show biliary duct obstruction.  She has already seen surgery and cholecystectomy is planned in 10 days.   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 She has never used smokeless tobacco. She reports that she does not currently use alcohol. She reports that she does not use drugs.  Allergies: No Known Allergies  Family History:  Family History  Problem Relation Age of Onset   Heart defect Mother        recently admitted to [facility] per patient-needs valve replacement   Hypertension Father    Breast cancer Paternal Grandmother    Colon cancer Neg Hx    Colon polyps Neg Hx    Esophageal cancer Neg Hx    Rectal cancer Neg Hx    Stomach cancer Neg Hx      Current Outpatient Medications:    Acetaminophen (TYLENOL PO), Take by mouth., Disp: , Rfl:    medroxyPROGESTERone (DEPO-PROVERA) 150 MG/ML injection, Inject 1 mL (150 mg total) into the muscle every 3 (three) months., Disp: 1 mL, Rfl: 4   MILK THISTLE PO, Take by mouth., Disp: , Rfl:    Multiple Vitamin (MULTIVITAMIN) tablet, Take 1 tablet by mouth daily., Disp: , Rfl:   Review of Systems:  Negative unless indicated in HPI.   Physical Exam: Vitals:   12/30/22  0718  BP: 110/80  Pulse: 73  Temp: 98.5 F (36.9 C)  TempSrc: Oral  SpO2: 98%  Weight: 197 lb 8 oz (89.6 kg)    Body mass index is 34.99 kg/m.   Physical Exam Vitals reviewed.  Constitutional:      Appearance: Normal appearance.  HENT:     Head: Normocephalic and atraumatic.  Eyes:     Conjunctiva/sclera: Conjunctivae normal.     Pupils: Pupils are equal, round, and reactive to light.  Pulmonary:     Breath sounds: Normal breath sounds.  Skin:    General: Skin is warm and dry.  Neurological:     General: No focal deficit present.     Mental Status: She is alert and oriented to person, place, and time.  Psychiatric:        Mood and Affect: Mood normal.        Behavior: Behavior normal.        Thought Content: Thought content normal.        Judgment: Judgment normal.      Impression and Plan:  Encounter for Depo-Provera contraception -     medroxyPROGESTERone Acetate  Calculus of gallbladder without cholecystitis without obstruction  -Depo-Provera administered today. -She is scheduled for cholecystectomy in  10 days.   Time spent:23 minutes reviewing chart, interviewing and examining patient and formulating plan of care.     Chaya Jan, MD [facility] at [facility]

## 2023-01-06 NOTE — Patient Instructions (Signed)
DUE TO COVID-19 ONLY TWO VISITORS  (aged 54 and older)  ARE ALLOWED TO COME WITH YOU AND STAY IN THE WAITING ROOM ONLY DURING PRE OP AND PROCEDURE.   **NO VISITORS ARE ALLOWED IN THE SHORT STAY AREA OR RECOVERY ROOM!!**  IF YOU WILL BE ADMITTED INTO THE HOSPITAL YOU ARE ALLOWED ONLY FOUR SUPPORT PEOPLE DURING VISITATION HOURS ONLY (7 AM -8PM)   The support person(s) must pass our screening, gel in and out, and wear a mask at all times, including in the patient's room. Patients must also wear a mask when staff or their support person are in the room. Visitors GUEST BADGE MUST BE WORN VISIBLY  One adult visitor may remain with you overnight and MUST be in the room by 8 P.M.     Your procedure is scheduled on: 01/12/23   Report to [facility] Main Entrance    Report to admitting at : 6:15 AM   Call this number if you have problems the morning of surgery [phone]   Eat a light diet the day before surgery.  Examples including soups, broths, toast, yogurt, mashed potatoes.  Things to avoid include carbonated beverages (fizzy beverages), raw fruits and raw vegetables, or beans.   If your bowels are filled with gas, your surgeon will have difficulty visualizing your pelvic organs which increases your surgical risks. Do not eat food :After Midnight.   After Midnight you may have the following liquids until : 5:30 AM DAY OF SURGERY  Water Black Coffee (sugar ok, NO MILK/CREAM OR CREAMERS)  Tea (sugar ok, NO MILK/CREAM OR CREAMERS) regular and decaf                             Plain Jell-O (NO RED)                                           Fruit ices (not with fruit pulp, NO RED)                                     Popsicles (NO RED)                                                                  Juice: apple, WHITE grape, WHITE cranberry Sports drinks like Gatorade (NO RED)   The day of surgery:  Drink ONE (1) Pre-Surgery Clear Ensure at: 5:30 AM the morning of surgery. Drink in  one sitting. Do not sip.  This drink was given to you during your hospital  pre-op appointment visit. Nothing else to drink after completing the  Pre-Surgery Clear Ensure or G2.          If you have questions, please contact your surgeon's office.    Oral Hygiene is also important to reduce your risk of infection.                                    Remember -  BRUSH YOUR TEETH THE MORNING OF SURGERY WITH YOUR REGULAR TOOTHPASTE  DENTURES WILL BE REMOVED PRIOR TO SURGERY PLEASE DO NOT APPLY "Poly grip" OR ADHESIVES!!!   Do NOT smoke after Midnight   Take these medicines the morning of surgery with A SIP OF WATER: N/A                              You may not have any metal on your body including hair pins, jewelry, and body piercing             Do not wear make-up, lotions, powders, perfumes/cologne, or deodorant  Do not wear nail polish including gel and S&S, artificial/acrylic nails, or any other type of covering on natural nails including finger and toenails. If you have artificial nails, gel coating, etc. that needs to be removed by a nail salon please have this removed prior to surgery or surgery may need to be canceled/ delayed if the surgeon/ anesthesia feels like they are unable to be safely monitored.   Do not shave  48 hours prior to surgery.               Men may shave face and neck.   Do not bring valuables to the hospital. [facility] IS NOT             RESPONSIBLE   FOR VALUABLES.   Contacts, glasses, or bridgework may not be worn into surgery.   Bring small overnight bag day of surgery.   DO NOT BRING YOUR HOME MEDICATIONS TO THE HOSPITAL. PHARMACY WILL DISPENSE MEDICATIONS LISTED ON YOUR MEDICATION LIST TO YOU DURING YOUR ADMISSION IN THE HOSPITAL!    Patients discharged on the day of surgery will not be allowed to drive home.  Someone NEEDS to stay with you for the first 24 hours after anesthesia.   Special Instructions: Bring a copy of your healthcare power of  attorney and living will documents         the day of surgery if you haven't scanned them before.              Please read over the following fact sheets you were given: IF YOU HAVE QUESTIONS ABOUT YOUR PRE-OP INSTRUCTIONS PLEASE CALL [phone]    [facility] - Preparing for Surgery Before surgery, you can play an important role.  Because skin is not sterile, your skin needs to be as free of germs as possible.  You can reduce the number of germs on your skin by washing with CHG (chlorahexidine gluconate) soap before surgery.  CHG is an antiseptic cleaner which kills germs and bonds with the skin to continue killing germs even after washing. Please DO NOT use if you have an allergy to CHG or antibacterial soaps.  If your skin becomes reddened/irritated stop using the CHG and inform your nurse when you arrive at Short Stay. Do not shave (including legs and underarms) for at least 48 hours prior to the first CHG shower.  You may shave your face/neck. Please follow these instructions carefully:  1.  Shower with CHG Soap the night before surgery and the  morning of Surgery.  2.  If you choose to wash your hair, wash your hair first as usual with your  normal  shampoo.  3.  After you shampoo, rinse your hair and body thoroughly to remove the  shampoo.  4.  Use CHG as you would any other liquid soap.  You can apply chg directly  to the skin and wash                       Gently with a scrungie or clean washcloth.  5.  Apply the CHG Soap to your body ONLY FROM THE NECK DOWN.   Do not use on face/ open                           Wound or open sores. Avoid contact with eyes, ears mouth and genitals (private parts).                       Wash face,  Genitals (private parts) with your normal soap.             6.  Wash thoroughly, paying special attention to the area where your surgery  will be performed.  7.  Thoroughly rinse your body with warm water from the neck down.  8.  DO NOT  shower/wash with your normal soap after using and rinsing off  the CHG Soap.                9.  Pat yourself dry with a clean towel.            10.  Wear clean pajamas.            11.  Place clean sheets on your bed the night of your first shower and do not  sleep with pets. Day of Surgery : Do not apply any lotions/deodorants the morning of surgery.  Please wear clean clothes to the hospital/surgery center.  FAILURE TO FOLLOW THESE INSTRUCTIONS MAY RESULT IN THE CANCELLATION OF YOUR SURGERY PATIENT SIGNATURE_________________________________  NURSE SIGNATURE__________________________________  ________________________________________________________________________

## 2023-01-07 ENCOUNTER — Encounter (HOSPITAL_COMMUNITY)
Admission: RE | Admit: 2023-01-07 | Discharge: 2023-01-07 | Disposition: A | Discharge status: Home / Self Care | Payer: No Typology Code available for payment source | Source: Ambulatory Visit | Attending: General Surgery | Admitting: General Surgery

## 2023-01-07 ENCOUNTER — Other Ambulatory Visit: Payer: Self-pay

## 2023-01-07 ENCOUNTER — Encounter (HOSPITAL_COMMUNITY): Payer: Self-pay

## 2023-01-07 VITALS — BP 135/85 | HR 84 | Temp 97.6°F | Ht 63.0 in | Wt 195.0 lb

## 2023-01-07 DIAGNOSIS — Z01812 Encounter for preprocedural laboratory examination: Secondary | ICD-10-CM | POA: Insufficient documentation

## 2023-01-07 DIAGNOSIS — Z01818 Encounter for other preprocedural examination: Secondary | ICD-10-CM

## 2023-01-07 LAB — CBC
HCT: 45.5 % (ref 36.0–46.0)
Hemoglobin: 14.8 g/dL (ref 12.0–15.0)
MCH: 31.4 pg (ref 26.0–34.0)
MCHC: 32.5 g/dL (ref 30.0–36.0)
MCV: 96.4 fL (ref 80.0–100.0)
Platelets: 406 10*3/uL — ABNORMAL HIGH (ref 150–400)
RBC: 4.72 MIL/uL (ref 3.87–5.11)
RDW: 12.3 % (ref 11.5–15.5)
WBC: 8.1 10*3/uL (ref 4.0–10.5)
nRBC: 0 % (ref 0.0–0.2)

## 2023-01-07 NOTE — Progress Notes (Signed)
For Short Stay: COVID SWAB appointment date:  Bowel Prep reminder:   For Anesthesia: PCP - Dr. Maurine Minister. LOV: 12/30/22 Cardiologist - N/A  Chest x-ray -  EKG -  Stress Test -  ECHO -  Cardiac Cath -  Pacemaker/ICD device last checked: Pacemaker orders received: Device Rep notified:  Spinal Cord Stimulator:  Sleep Study - N/A CPAP -   Fasting Blood Sugar - N/A Checks Blood Sugar _____ times a day Date and result of last Hgb A1c-  Last dose of GLP1 agonist- N/A GLP1 instructions:   Last dose of SGLT-2 inhibitors- N/A SGLT-2 instructions:   Blood Thinner Instructions: N/A Aspirin Instructions: Last Dose:  Activity level: Can go up a flight of stairs and activities of daily living without stopping and without chest pain and/or shortness of breath   Able to exercise without chest pain and/or shortness of breath  Anesthesia review:   Patient denies shortness of breath, fever, cough and chest pain at PAT appointment   Patient verbalized understanding of instructions that were given to them at the PAT appointment. Patient was also instructed that they will need to review over the PAT instructions again at home before surgery.

## 2023-01-11 ENCOUNTER — Encounter (HOSPITAL_COMMUNITY): Payer: Self-pay | Admitting: General Surgery

## 2023-01-11 NOTE — H&P (Signed)
  52 year old female who has had right upper quadrant pain a number of times for the past 7 years. She was told 7 years ago she had sludge in her gallbladder and was recommended cholecystectomy. She changed her diet and felt better. She has had 6 episodes over that time. A couple of weeks ago she had the most significant episode. She was evaluated weighted with an ultrasound. There is a small liver cyst. There is cholelithiasis with no signs of acute cholecystitis and some what appears to be steatosis of the liver. There are large stones in her gallbladder. Her LFTs are mildly elevated so she underwent an MRCP. This shows diffuse fatty infiltration of the liver as well as no common bile duct stones and numerous large and small gallstones in the gallbladder. She comes in today to discuss her options.  Review of Systems: A complete review of systems was obtained from the patient. I have reviewed this information and discussed as appropriate with the patient. See HPI as well for other ROS.  Review of Systems  Gastrointestinal: Positive for abdominal pain.  All other systems reviewed and are negative.   Medical History: History reviewed. No pertinent past medical history.  There is no problem list on file for this patient.  Past Surgical History:  Procedure Laterality Date  CESAREAN SECTION 2008    No Known Allergies  Current Outpatient Medications on File Prior to Visit  Medication Sig Dispense Refill  multivitamin tablet Take 1 tablet by mouth once daily   No current facility-administered medications on file prior to visit.   History reviewed. No pertinent family history.   Social History   Tobacco Use  Smoking Status Never  Smokeless Tobacco Never    Social History   Socioeconomic History  Marital status: Married  Tobacco Use  Smoking status: Never  Smokeless tobacco: Never  Substance and Sexual Activity  Alcohol use: Never  Drug use: Never   Objective:   Vitals:   12/24/22 0939 12/24/22 0945  BP: (!) 142/70  Pulse: 93  Temp: 36.8 C (98.3 F)  SpO2: 96%  Weight: 89.7 kg (197 lb 12.8 oz)  Height: 160 cm (5\' 3" )  PainSc: 0-No pain  PainLoc: Abdomen   Body mass index is 35.04 kg/m.  Physical Exam Vitals reviewed.  Constitutional:  Appearance: Normal appearance.  Eyes:  General: No scleral icterus. Abdominal:  General: There is no distension.  Palpations: Abdomen is soft.  Tenderness: There is no abdominal tenderness.  Neurological:  Mental Status: She is alert.    Assessment and Plan:   Gallstones  Laparoscopic cholecystectomy, IOC, ICG dye  I do think symptoms are related to gb. Will do cholangiogram due to LFTs.  I discussed the procedure in detail. We discussed the risks and benefits of a laparoscopic cholecystectomy and possible cholangiogram including, but not limited to bleeding, infection, injury to surrounding structures such as the intestine or liver, bile leak, retained gallstones, need to convert to an open procedure, prolonged diarrhea, blood clots such as DVT, common bile duct injury, anesthesia risks, and possible need for additional procedures. The likelihood of improvement in symptoms and return to the patient's normal status is good. We discussed the typical post-operative recovery course.

## 2023-01-11 NOTE — Anesthesia Preprocedure Evaluation (Signed)
Anesthesia Evaluation  Patient identified by MRN, date of birth, ID band Patient awake    Reviewed: Allergy & Precautions, NPO status , Patient's Chart, lab work & pertinent test results  Airway Mallampati: II  TM Distance: >3 FB Neck ROM: Full    Dental no notable dental hx. (+) Teeth Intact   Pulmonary neg pulmonary ROS   Pulmonary exam normal breath sounds clear to auscultation       Cardiovascular negative cardio ROS Normal cardiovascular exam Rhythm:Regular Rate:Normal     Neuro/Psych  Headaches  Anxiety        GI/Hepatic Elevated LFT's Cholelithiasis with possible choledocholithiasis   Endo/Other  Obesity  Renal/GU negative Renal ROS  negative genitourinary   Musculoskeletal negative musculoskeletal ROS (+)    Abdominal  (+) + obese  Peds  Hematology  (+) Blood dyscrasia, anemia   Anesthesia Other Findings   Reproductive/Obstetrics                              Anesthesia Physical Anesthesia Plan  ASA: 2  Anesthesia Plan: General   Post-op Pain Management: Dilaudid IV, Tylenol PO (pre-op)*, Precedex, Regional block* and Ketamine IV*   Induction: Intravenous, Rapid sequence and Cricoid pressure planned  PONV Risk Score and Plan: 4 or greater and Treatment may vary due to age or medical condition, Midazolam, Scopolamine patch - Pre-op, Ondansetron and Dexamethasone  Airway Management Planned: Oral ETT  Additional Equipment: None  Intra-op Plan:   Post-operative Plan: Extubation in OR  Informed Consent: I have reviewed the patients History and Physical, chart, labs and discussed the procedure including the risks, benefits and alternatives for the proposed anesthesia with the patient or authorized representative who has indicated his/her understanding and acceptance.     Dental advisory given  Plan Discussed with: CRNA and Anesthesiologist  Anesthesia Plan Comments:           Anesthesia Quick Evaluation

## 2023-01-12 ENCOUNTER — Ambulatory Visit (HOSPITAL_BASED_OUTPATIENT_CLINIC_OR_DEPARTMENT_OTHER): Payer: No Typology Code available for payment source | Admitting: Anesthesiology

## 2023-01-12 ENCOUNTER — Other Ambulatory Visit: Payer: Self-pay

## 2023-01-12 ENCOUNTER — Ambulatory Visit (HOSPITAL_COMMUNITY): Payer: No Typology Code available for payment source | Admitting: Physician Assistant

## 2023-01-12 ENCOUNTER — Ambulatory Visit (HOSPITAL_COMMUNITY): Payer: No Typology Code available for payment source

## 2023-01-12 ENCOUNTER — Ambulatory Visit (HOSPITAL_COMMUNITY)
Admission: RE | Admit: 2023-01-12 | Discharge: 2023-01-12 | Disposition: A | Discharge status: Home / Self Care | Payer: No Typology Code available for payment source | Attending: General Surgery | Admitting: General Surgery

## 2023-01-12 ENCOUNTER — Encounter (HOSPITAL_COMMUNITY): Payer: Self-pay | Admitting: General Surgery

## 2023-01-12 ENCOUNTER — Encounter (HOSPITAL_COMMUNITY)
Admission: RE | Disposition: A | Discharge status: Home / Self Care | Payer: Self-pay | Source: Home / Self Care | Attending: General Surgery

## 2023-01-12 DIAGNOSIS — K7689 Other specified diseases of liver: Secondary | ICD-10-CM | POA: Diagnosis not present

## 2023-01-12 DIAGNOSIS — E669 Obesity, unspecified: Secondary | ICD-10-CM | POA: Insufficient documentation

## 2023-01-12 DIAGNOSIS — K8010 Calculus of gallbladder with chronic cholecystitis without obstruction: Secondary | ICD-10-CM | POA: Insufficient documentation

## 2023-01-12 DIAGNOSIS — Z01818 Encounter for other preprocedural examination: Secondary | ICD-10-CM

## 2023-01-12 DIAGNOSIS — D649 Anemia, unspecified: Secondary | ICD-10-CM

## 2023-01-12 DIAGNOSIS — Z6834 Body mass index (BMI) 34.0-34.9, adult: Secondary | ICD-10-CM

## 2023-01-12 DIAGNOSIS — Z6835 Body mass index (BMI) 35.0-35.9, adult: Secondary | ICD-10-CM | POA: Diagnosis not present

## 2023-01-12 DIAGNOSIS — K760 Fatty (change of) liver, not elsewhere classified: Secondary | ICD-10-CM | POA: Insufficient documentation

## 2023-01-12 HISTORY — PX: CHOLECYSTECTOMY: SHX55

## 2023-01-12 LAB — COMPREHENSIVE METABOLIC PANEL
ALT: 34 U/L (ref 0–44)
AST: 27 U/L (ref 15–41)
Albumin: 3.8 g/dL (ref 3.5–5.0)
Alkaline Phosphatase: 111 U/L (ref 38–126)
Anion gap: 9 (ref 5–15)
BUN: 10 mg/dL (ref 6–20)
CO2: 21 mmol/L — ABNORMAL LOW (ref 22–32)
Calcium: 10.1 mg/dL (ref 8.9–10.3)
Chloride: 106 mmol/L (ref 98–111)
Creatinine, Ser: 0.76 mg/dL (ref 0.44–1.00)
GFR, Estimated: >60 mL/min (ref >60)
Glucose, Bld: 126 mg/dL — ABNORMAL HIGH (ref 70–99)
Potassium: 3.6 mmol/L (ref 3.5–5.1)
Sodium: 136 mmol/L (ref 135–145)
Total Bilirubin: 1.6 mg/dL — ABNORMAL HIGH (ref 0.3–1.2)
Total Protein: 7.9 g/dL (ref 6.5–8.1)

## 2023-01-12 LAB — POCT PREGNANCY, URINE: Preg Test, Ur: NEGATIVE

## 2023-01-12 SURGERY — LAPAROSCOPIC CHOLECYSTECTOMY WITH INTRAOPERATIVE CHOLANGIOGRAM
Anesthesia: General

## 2023-01-12 MED ORDER — HEMOSTATIC AGENTS (NO CHARGE) OPTIME
TOPICAL | Status: DC | PRN
  Administered 2023-01-12: 1 via TOPICAL

## 2023-01-12 MED ORDER — LIDOCAINE HCL (PF) 2 % IJ SOLN
INTRAMUSCULAR | Status: AC
  Filled 2023-01-12: qty 5

## 2023-01-12 MED ORDER — ENSURE PRE-SURGERY PO LIQD
296.0000 mL | Freq: Once | ORAL | Status: DC
  Filled 2023-01-12: qty 296

## 2023-01-12 MED ORDER — CHLORHEXIDINE GLUCONATE CLOTH 2 % EX PADS: 6.0000 | MEDICATED_PAD | Freq: Once | CUTANEOUS | Status: DC

## 2023-01-12 MED ORDER — PHENYLEPHRINE 80 MCG/ML (10ML) SYRINGE FOR IV PUSH (FOR BLOOD PRESSURE SUPPORT)
PREFILLED_SYRINGE | INTRAVENOUS | Status: AC
  Filled 2023-01-12: qty 10

## 2023-01-12 MED ORDER — FENTANYL CITRATE (PF) 100 MCG/2ML IJ SOLN
INTRAMUSCULAR | Status: AC
  Filled 2023-01-12: qty 2

## 2023-01-12 MED ORDER — SUGAMMADEX SODIUM 200 MG/2ML IV SOLN
INTRAVENOUS | Status: DC | PRN
  Administered 2023-01-12: 200 mg via INTRAVENOUS

## 2023-01-12 MED ORDER — DEXAMETHASONE SODIUM PHOSPHATE 10 MG/ML IJ SOLN
INTRAMUSCULAR | Status: AC
  Filled 2023-01-12: qty 1

## 2023-01-12 MED ORDER — KETOROLAC TROMETHAMINE 30 MG/ML IJ SOLN
INTRAMUSCULAR | Status: DC | PRN
  Administered 2023-01-12: 30 mg via INTRAVENOUS

## 2023-01-12 MED ORDER — AMISULPRIDE (ANTIEMETIC) 5 MG/2ML IV SOLN: 10.0000 mg | Freq: Once | INTRAVENOUS | Status: AC | PRN

## 2023-01-12 MED ORDER — 0.9 % SODIUM CHLORIDE (POUR BTL) OPTIME
TOPICAL | Status: DC | PRN
  Administered 2023-01-12: 1000 mL

## 2023-01-12 MED ORDER — SCOPOLAMINE 1 MG/3DAYS TD PT72: 1.0000 | MEDICATED_PATCH | TRANSDERMAL | Status: DC

## 2023-01-12 MED ORDER — FENTANYL CITRATE (PF) 100 MCG/2ML IJ SOLN
INTRAMUSCULAR | Status: DC | PRN
  Administered 2023-01-12 (×2): 100 ug via INTRAVENOUS

## 2023-01-12 MED ORDER — LACTATED RINGERS IR SOLN
Status: DC | PRN
  Administered 2023-01-12: 1000 mL

## 2023-01-12 MED ORDER — ROCURONIUM BROMIDE 10 MG/ML (PF) SYRINGE
PREFILLED_SYRINGE | INTRAVENOUS | Status: AC
  Filled 2023-01-12: qty 10

## 2023-01-12 MED ORDER — ONDANSETRON HCL 4 MG/2ML IJ SOLN
INTRAMUSCULAR | Status: AC
  Filled 2023-01-12: qty 2

## 2023-01-12 MED ORDER — ONDANSETRON HCL 4 MG/2ML IJ SOLN
INTRAMUSCULAR | Status: DC | PRN
  Administered 2023-01-12: 4 mg via INTRAVENOUS

## 2023-01-12 MED ORDER — PROPOFOL 10 MG/ML IV BOLUS
INTRAVENOUS | Status: DC | PRN
  Administered 2023-01-12: 170 mg via INTRAVENOUS

## 2023-01-12 MED ORDER — ACETAMINOPHEN 500 MG PO TABS
1000.0000 mg | ORAL_TABLET | ORAL | Status: AC
  Administered 2023-01-12: 1000 mg via ORAL
  Filled 2023-01-12: qty 2

## 2023-01-12 MED ORDER — BUPIVACAINE LIPOSOME 1.3 % IJ SUSP
INTRAMUSCULAR | Status: DC | PRN
  Administered 2023-01-12 (×2): 5 mL via PERINEURAL

## 2023-01-12 MED ORDER — PHENYLEPHRINE 80 MCG/ML (10ML) SYRINGE FOR IV PUSH (FOR BLOOD PRESSURE SUPPORT)
PREFILLED_SYRINGE | INTRAVENOUS | Status: DC | PRN
  Administered 2023-01-12: 160 ug via INTRAVENOUS

## 2023-01-12 MED ORDER — KETOROLAC TROMETHAMINE 30 MG/ML IJ SOLN
INTRAMUSCULAR | Status: AC
  Filled 2023-01-12: qty 1

## 2023-01-12 MED ORDER — OXYCODONE HCL 5 MG/5ML PO SOLN: 5.0000 mg | Freq: Once | ORAL | Status: AC | PRN

## 2023-01-12 MED ORDER — ONDANSETRON HCL 4 MG/2ML IJ SOLN: 4.0000 mg | Freq: Once | INTRAMUSCULAR | Status: DC | PRN

## 2023-01-12 MED ORDER — AMISULPRIDE (ANTIEMETIC) 5 MG/2ML IV SOLN
INTRAVENOUS | Status: AC
  Administered 2023-01-12: 10 mg via INTRAVENOUS
  Filled 2023-01-12: qty 4

## 2023-01-12 MED ORDER — HYDROMORPHONE HCL 1 MG/ML IJ SOLN
0.2500 mg | INTRAMUSCULAR | Status: DC | PRN
  Administered 2023-01-12: 0.25 mg via INTRAVENOUS

## 2023-01-12 MED ORDER — DEXAMETHASONE SODIUM PHOSPHATE 10 MG/ML IJ SOLN
INTRAMUSCULAR | Status: DC | PRN
  Administered 2023-01-12: 10 mg via INTRAVENOUS

## 2023-01-12 MED ORDER — ORAL CARE MOUTH RINSE: 15.0000 mL | Freq: Once | OROMUCOSAL | Status: AC

## 2023-01-12 MED ORDER — BUPIVACAINE-EPINEPHRINE 0.25% -1:200000 IJ SOLN
INTRAMUSCULAR | Status: AC
  Filled 2023-01-12: qty 1

## 2023-01-12 MED ORDER — EPHEDRINE 5 MG/ML INJ
INTRAVENOUS | Status: AC
  Filled 2023-01-12: qty 5

## 2023-01-12 MED ORDER — ROCURONIUM BROMIDE 100 MG/10ML IV SOLN
INTRAVENOUS | Status: DC | PRN
  Administered 2023-01-12: 70 mg via INTRAVENOUS

## 2023-01-12 MED ORDER — BUPIVACAINE LIPOSOME 1.3 % IJ SUSP: INTRAMUSCULAR | Status: DC | PRN

## 2023-01-12 MED ORDER — MIDAZOLAM HCL 2 MG/2ML IJ SOLN
INTRAMUSCULAR | Status: AC
  Filled 2023-01-12: qty 2

## 2023-01-12 MED ORDER — BUPIVACAINE HCL (PF) 0.25 % IJ SOLN
INTRAMUSCULAR | Status: DC | PRN
  Administered 2023-01-12 (×2): 20 mL via PERINEURAL

## 2023-01-12 MED ORDER — EPHEDRINE SULFATE-NACL 50-0.9 MG/10ML-% IV SOSY
PREFILLED_SYRINGE | INTRAVENOUS | Status: DC | PRN
  Administered 2023-01-12: 5 mg via INTRAVENOUS

## 2023-01-12 MED ORDER — MIDAZOLAM HCL 5 MG/5ML IJ SOLN
INTRAMUSCULAR | Status: DC | PRN
  Administered 2023-01-12: 2 mg via INTRAVENOUS

## 2023-01-12 MED ORDER — HYDROMORPHONE HCL 1 MG/ML IJ SOLN
INTRAMUSCULAR | Status: AC
  Administered 2023-01-12: 0.25 mg via INTRAVENOUS
  Filled 2023-01-12: qty 1

## 2023-01-12 MED ORDER — PROPOFOL 10 MG/ML IV BOLUS
INTRAVENOUS | Status: AC
  Filled 2023-01-12: qty 20

## 2023-01-12 MED ORDER — SUCCINYLCHOLINE CHLORIDE 200 MG/10ML IV SOSY
PREFILLED_SYRINGE | INTRAVENOUS | Status: AC
  Filled 2023-01-12: qty 10

## 2023-01-12 MED ORDER — OXYCODONE HCL 5 MG PO TABS: 5.0000 mg | ORAL_TABLET | Freq: Once | ORAL | Status: AC | PRN

## 2023-01-12 MED ORDER — OXYCODONE HCL 5 MG PO TABS
ORAL_TABLET | ORAL | Status: AC
  Administered 2023-01-12: 5 mg via ORAL
  Filled 2023-01-12: qty 1

## 2023-01-12 MED ORDER — LIDOCAINE HCL (CARDIAC) PF 100 MG/5ML IV SOSY
PREFILLED_SYRINGE | INTRAVENOUS | Status: DC | PRN
  Administered 2023-01-12: 80 mg via INTRAVENOUS

## 2023-01-12 MED ORDER — CHLORHEXIDINE GLUCONATE 0.12 % MT SOLN
15.0000 mL | Freq: Once | OROMUCOSAL | Status: AC
  Administered 2023-01-12: 15 mL via OROMUCOSAL

## 2023-01-12 MED ORDER — INDOCYANINE GREEN 25 MG IV SOLR
1.2500 mg | Freq: Once | INTRAVENOUS | Status: AC
  Administered 2023-01-12: 1.25 mg via INTRAVENOUS

## 2023-01-12 MED ORDER — BUPIVACAINE-EPINEPHRINE 0.25% -1:200000 IJ SOLN
INTRAMUSCULAR | Status: DC | PRN
  Administered 2023-01-12: 15 mL

## 2023-01-12 MED ORDER — LACTATED RINGERS IV SOLN: INTRAVENOUS | Status: DC

## 2023-01-12 MED ORDER — CEFAZOLIN SODIUM-DEXTROSE 2-4 GM/100ML-% IV SOLN
2.0000 g | INTRAVENOUS | Status: AC
  Administered 2023-01-12: 2 g via INTRAVENOUS
  Filled 2023-01-12: qty 100

## 2023-01-12 MED ORDER — OXYCODONE HCL 5 MG PO TABS: 5.0000 mg | ORAL_TABLET | Freq: Four times a day (QID) | ORAL | 0 refills | Status: DC | PRN

## 2023-01-12 SURGICAL SUPPLY — 49 items
ADH SKN CLS APL DERMABOND .7 (GAUZE/BANDAGES/DRESSINGS)
APL PRP STRL LF DISP 70% ISPRP (MISCELLANEOUS) ×1
APL SKNCLS STERI-STRIP NONHPOA (GAUZE/BANDAGES/DRESSINGS)
APPLIER CLIP 5 13 M/L LIGAMAX5 (MISCELLANEOUS) ×1
APPLIER CLIP ROT 10 11.4 M/L (STAPLE)
APR CLP MED LRG 11.4X10 (STAPLE)
APR CLP MED LRG 5 ANG JAW (MISCELLANEOUS) ×1
BAG COUNTER SPONGE SURGICOUNT (BAG) IMPLANT
BAG SPEC RTRVL 10 TROC 200 (ENDOMECHANICALS) ×1
BAG SPNG CNTER NS LX DISP (BAG)
BENZOIN TINCTURE PRP APPL 2/3 (GAUZE/BANDAGES/DRESSINGS) IMPLANT
CABLE HIGH FREQUENCY MONO STRZ (ELECTRODE) ×1 IMPLANT
CHLORAPREP W/TINT 26 (MISCELLANEOUS) ×1 IMPLANT
CLIP APPLIE 5 13 M/L LIGAMAX5 (MISCELLANEOUS) ×1 IMPLANT
CLIP APPLIE ROT 10 11.4 M/L (STAPLE) IMPLANT
COVER MAYO STAND XLG (MISCELLANEOUS) ×1 IMPLANT
COVER SURGICAL LIGHT HANDLE (MISCELLANEOUS) ×1 IMPLANT
DERMABOND ADVANCED .7 DNX12 (GAUZE/BANDAGES/DRESSINGS) IMPLANT
DRAPE C-ARM 42X120 X-RAY (DRAPES) IMPLANT
ELECT REM PT RETURN 15FT ADLT (MISCELLANEOUS) ×1 IMPLANT
ENDOLOOP SUT PDS II 0 18 (SUTURE) IMPLANT
GLOVE BIO SURGEON STRL SZ7 (GLOVE) ×1 IMPLANT
GLOVE BIOGEL PI IND STRL 7.5 (GLOVE) ×1 IMPLANT
GOWN STRL REUS W/ TWL LRG LVL3 (GOWN DISPOSABLE) ×1 IMPLANT
GOWN STRL REUS W/TWL LRG LVL3 (GOWN DISPOSABLE) ×1
GRASPER SUT TROCAR 14GX15 (MISCELLANEOUS) IMPLANT
HEMOSTAT SNOW SURGICEL 2X4 (HEMOSTASIS) IMPLANT
IRRIG SUCT STRYKERFLOW 2 WTIP (MISCELLANEOUS) ×1
IRRIGATION SUCT STRKRFLW 2 WTP (MISCELLANEOUS) ×1 IMPLANT
KIT BASIN OR (CUSTOM PROCEDURE TRAY) ×1 IMPLANT
KIT TURNOVER KIT A (KITS) IMPLANT
PENCIL SMOKE EVACUATOR (MISCELLANEOUS) IMPLANT
POUCH RETRIEVAL ECOSAC 10 (ENDOMECHANICALS) ×1 IMPLANT
PROTECTOR NERVE ULNAR (MISCELLANEOUS) IMPLANT
SCISSORS LAP 5X35 DISP (ENDOMECHANICALS) ×1 IMPLANT
SET CHOLANGIOGRAPH MIX (MISCELLANEOUS) IMPLANT
SET TUBE SMOKE EVAC HIGH FLOW (TUBING) ×1 IMPLANT
SLEEVE Z-THREAD 5X100MM (TROCAR) ×2 IMPLANT
SPIKE FLUID TRANSFER (MISCELLANEOUS) ×1 IMPLANT
STRIP CLOSURE SKIN 1/2X4 (GAUZE/BANDAGES/DRESSINGS) IMPLANT
SUT MNCRL AB 4-0 PS2 18 (SUTURE) ×1 IMPLANT
SUT VICRYL 0 TIES 12 18 (SUTURE) IMPLANT
SUT VICRYL 0 UR6 27IN ABS (SUTURE) IMPLANT
TOWEL OR 17X26 10 PK STRL BLUE (TOWEL DISPOSABLE) ×1 IMPLANT
TOWEL OR NON WOVEN STRL DISP B (DISPOSABLE) ×1 IMPLANT
TRAY LAPAROSCOPIC (CUSTOM PROCEDURE TRAY) ×1 IMPLANT
TROCAR 11X100 Z THREAD (TROCAR) IMPLANT
TROCAR BALLN 12MMX100 BLUNT (TROCAR) ×1 IMPLANT
TROCAR Z-THREAD OPTICAL 5X100M (TROCAR) ×1 IMPLANT

## 2023-01-12 NOTE — Discharge Instructions (Signed)
[facility] LAPAROSCOPIC SURGERY: POST OP INSTRUCTIONS  Always review your discharge instruction sheet given to you by the facility where your surgery was performed. IF YOU HAVE DISABILITY OR FAMILY LEAVE FORMS, YOU MUST BRING THEM TO THE OFFICE FOR PROCESSING.   DO NOT GIVE THEM TO YOUR DOCTOR.  A prescription for pain medication may be given to you upon discharge.  Take your pain medication as prescribed, if needed.  If narcotic pain medicine is not needed, then you may take acetaminophen (Tylenol), naprosyn (Alleve), or ibuprofen (Advil) as needed. Take your usually prescribed medications unless otherwise directed. If you need a refill on your pain medication, please contact your pharmacy.  They will contact our office to request authorization. Prescriptions will not be filled after 5pm or on week-ends. You should follow a light diet the first few days after arrival home, such as soup and crackers, etc.  Be sure to include lots of fluids daily. Most patients will experience some swelling and bruising in the area of the incisions.  Ice packs will help.  Swelling and bruising can take several days to resolve.  It is common to experience some constipation if taking pain medication after surgery.  Increasing fluid intake and taking a stool softener (such as Colace) will usually help or prevent this problem from occurring.  A mild laxative (Milk of Magnesia or Miralax) should be taken according to package instructions if there are no bowel movements after 48 hours. Unless discharge instructions indicate otherwise, you may remove your bandages 48 hours after surgery, and you may shower at that time.  You may have steri-strips (small skin tapes) in place directly over the incision.  These strips should be left on the skin for 7-10 days.  If your surgeon used skin glue on the incision, you may shower in 24 hours.  The glue will flake off over the next  2-3 weeks.  Any sutures or staples will be removed at the office during your follow-up visit. ACTIVITIES:  You may resume regular (light) daily activities beginning the next day--such as daily self-care, walking, climbing stairs--gradually increasing activities as tolerated.  You may have sexual intercourse when it is comfortable.  Refrain from any heavy lifting or straining until approved by your doctor. You may drive when you are no longer taking prescription pain medication, you can comfortably wear a seatbelt, and you can safely maneuver your car and apply brakes. RETURN TO WORK:  __________________________________________________________ You should see your doctor in the office for a follow-up appointment approximately 2-3 weeks after your surgery.  Make sure that you call for this appointment within a day or two after you arrive home to insure a convenient appointment time. OTHER INSTRUCTIONS: __________________________________________________________________________________________________________________________ __________________________________________________________________________________________________________________________ WHEN TO CALL YOUR DOCTOR: Fever over 101.0 Inability to urinate Continued bleeding from incision. Increased pain, redness, or drainage from the incision. Increasing abdominal pain  The clinic staff is available to answer your questions during regular business hours.  Please don't hesitate to call and ask to speak to one of the nurses for clinical concerns.  If you have a medical emergency, go to the nearest emergency room or call 911.  A surgeon from [facility] is always on call at the hospital. [address], [address], [place], [place]  [address] ? [address] [address], [place], [place]   [address] [phone] ? [phone] ? FAX [phone] Web site: [email or web address]  

## 2023-01-12 NOTE — Anesthesia Procedure Notes (Signed)
Anesthesia Regional Block: TAP block   Pre-Anesthetic Checklist: , timeout performed,  Correct Patient, Correct Site, Correct Laterality,  Correct Procedure, Correct Position, site marked,  Risks and benefits discussed,  Surgical consent,  Pre-op evaluation,  At surgeon's request and post-op pain management  Laterality: Left  Prep: chloraprep       Needles:  Injection technique: Single-shot  Needle Type: Echogenic Stimulator Needle     Needle Length: 10cm  Needle Gauge: 21   Needle insertion depth: 8 cm   Additional Needles:   Procedures:,,,, ultrasound used (permanent image in chart),,    Narrative:  Start time: 01/12/2023 8:20 AM End time: 01/12/2023 8:25 AM Injection made incrementally with aspirations every 5 mL.  Performed by: Personally  Anesthesiologist: Mal Amabile, MD  Additional Notes: Timeout performed. Patient sedated. Relevant anatomy ID'd using [place]. Incremental 2-71ml injection of LA with frequent aspiration. Patient tolerated procedure well.     Left TAP Block

## 2023-01-12 NOTE — Anesthesia Procedure Notes (Signed)
Procedure Name: Intubation Date/Time: 01/12/2023 8:43 AM  Performed by: [place], Clydene Pugh, CRNAPre-anesthesia Checklist: Patient identified, Emergency Drugs available, Suction available and Patient being monitored Patient Re-evaluated:Patient Re-evaluated prior to induction Oxygen Delivery Method: Circle system utilized Preoxygenation: Pre-oxygenation with 100% oxygen Induction Type: IV induction Ventilation: Mask ventilation without difficulty Laryngoscope Size: Mac and 3 Grade View: Grade I Tube type: Oral Tube size: 7.0 mm Number of attempts: 1 Airway Equipment and Method: Stylet and Oral airway Placement Confirmation: ETT inserted through vocal cords under direct vision, positive ETCO2 and breath sounds checked- equal and bilateral Secured at: 21 cm Tube secured with: Tape Dental Injury: Teeth and Oropharynx as per pre-operative assessment

## 2023-01-12 NOTE — Anesthesia Procedure Notes (Signed)
Anesthesia Regional Block: TAP block   Pre-Anesthetic Checklist: , timeout performed,  Correct Patient, Correct Site, Correct Laterality,  Correct Procedure, Correct Position, site marked,  Risks and benefits discussed,  Surgical consent,  Pre-op evaluation,  At surgeon's request and post-op pain management  Laterality: Right  Prep: chloraprep       Needles:  Injection technique: Single-shot  Needle Type: Echogenic Stimulator Needle     Needle Length: 10cm  Needle Gauge: 21   Needle insertion depth: 8 cm   Additional Needles:   Procedures:,,,, ultrasound used (permanent image in chart),,    Narrative:  Start time: 01/12/2023 8:25 AM End time: 01/12/2023 8:30 AM Injection made incrementally with aspirations every 5 mL.  Performed by: Personally  Anesthesiologist: Mal Amabile, MD  Additional Notes: Timeout performed. Patient sedated. Relevant anatomy ID'd using [place]. Incremental 2-74ml injection of LA with frequent aspiration. Patient tolerated procedure well.     Right TAP Block

## 2023-01-12 NOTE — Interval H&P Note (Signed)
History and Physical Interval Note:  01/12/2023 7:20 AM  Ariel Hudson  has presented today for surgery, with the diagnosis of gallstones.  The various methods of treatment have been discussed with the patient and family. After consideration of risks, benefits and other options for treatment, the patient has consented to  Procedure(s): LAPAROSCOPIC CHOLECYSTECTOMY WITH INTRAOPERATIVE CHOLANGIOGRAM, ICG DYE (N/A) as a surgical intervention.  The patient's history has been reviewed, patient examined, no change in status, stable for surgery.  I have reviewed the patient's chart and labs.  Questions were answered to the patient's satisfaction.     [name]

## 2023-01-12 NOTE — Transfer of Care (Signed)
Immediate Anesthesia Transfer of Care Note  Patient: Ariel Hudson  Procedure(s) Performed: LAPAROSCOPIC CHOLECYSTECTOMY WITH ICG DYE  Patient Location: PACU  Anesthesia Type:General  Level of Consciousness: awake, alert , and oriented  Airway & Oxygen Therapy: Patient Spontanous Breathing and Patient connected to face mask oxygen  Post-op Assessment: Report given to RN and Post -op Vital signs reviewed and stable  Post vital signs: Reviewed and stable  Last Vitals:  Vitals Value Taken Time  BP 157/93 01/12/23 0953  Temp    Pulse 93 01/12/23 0957  Resp 21 01/12/23 0957  SpO2 100 % 01/12/23 0957  Vitals shown include unvalidated device data.  Last Pain:  Vitals:   01/12/23 0714  TempSrc: Oral         Complications: No notable events documented.

## 2023-01-12 NOTE — Op Note (Signed)
Preoperative diagnosis: Cholelithiasis Postoperative diagnosis: Cholelithiasis, chronic cholecystitis Procedure:  Laparoscopic cholecystectomy Surgeon: Dr. Harden Mo Anesthesia: General Estimated blood loss: Minimal Specimens: Gallbladder and contents to pathology Complications: None Drains: None Sponge needle count was correct at completion Disposition to recovery stable condition   Indications: 52 year old female who has had right upper quadrant pain a number of times for the past 7 years. She was told 7 years ago she had sludge in her gallbladder and was recommended cholecystectomy.  A couple of weeks ago she had the most significant episode. She had an [place].  There is a small liver cyst. There is cholelithiasis with no signs of acute cholecystitis and some what appears to be steatosis of the liver. There are large stones in her gallbladder. Her LFTs are mildly elevated so she underwent an MRCP. This shows diffuse fatty infiltration of the liver as well as no common bile duct stones and numerous large and small gallstones in the gallbladder. We discussed proceeding with lap chole.    Procedure: After informed consent was obtained she was taken to the operating room.  He was given antibiotics.  SCDs were in place.  He was placed under general anesthesia without complication.  He was prepped and draped in the standard sterile surgical fashion.  Surgical timeout was then performed.   I infiltrated Marcaine below the umbilicus.  I made a vertical incision.  I incised the fascia and entered the peritoneum bluntly.  This was done without injury.  I then placed a 0 Vicryl pursestring suture through the fascia and introduced a Hassan trocar.  The abdomen was insufflated to 15 mmHg pressure.  I then placed 3 additional 5 mm trocars in the epigastrium and right upper quadrant without difficulty.  The gallbladder was noted to have chronic cholecystitis. I had to bluntly take down the omentum from the  gallbladder as it was adherent.  I did use ICG dye and was able to use this to confirm my dissection.  I very clearly saw the common bile duct and the critical view of safety.  I was able to clip the artery 3 times and divided it.  The cystic duct appeared very near the common duct.   I elected not to do a cholangiogram due to this.  I took the gallbladder off of the liver completely. I then placed a clip on the cystic duct. I then placed 2 endoloops and divided the cystic duct very high.   I placed this in a retrieval bag and removed from the abdomen. I did remove some stones that had spilled.   I obtained hemostasis.  I irrigated and this was clear.  I did place some surgicel snow in the liver bed.  I then removed the [facility] trocar and tied my pursestring down.  I placed 2 additional 0 Vicryl sutures through the fascia and closed this.  This completely obliterated the umbilical defect.  The remaining trocars were removed.  These were closed with 4-0 Monocryl and glue.  She tolerated this well was extubated and transferred to recovery stable.

## 2023-01-12 NOTE — Anesthesia Postprocedure Evaluation (Signed)
Anesthesia Post Note  Patient: Ariel Hudson  Procedure(s) Performed: LAPAROSCOPIC CHOLECYSTECTOMY WITH ICG DYE     Patient location during evaluation: PACU Anesthesia Type: General Level of consciousness: awake and alert and oriented Pain management: pain level controlled Vital Signs Assessment: post-procedure vital signs reviewed and stable Respiratory status: spontaneous breathing, nonlabored ventilation and respiratory function stable Cardiovascular status: blood pressure returned to baseline and stable Postop Assessment: no apparent nausea or vomiting Anesthetic complications: no   No notable events documented.  Last Vitals:  Vitals:   01/12/23 1030 01/12/23 1039  BP: (!) 159/89   Pulse: 82 88  Resp: 18 19  Temp:  37 C  SpO2: 95% 94%    Last Pain:  Vitals:   01/12/23 1039  TempSrc: Oral  PainSc: 3                  Kol Consuegra A.

## 2023-01-13 ENCOUNTER — Encounter (HOSPITAL_COMMUNITY): Payer: Self-pay | Admitting: General Surgery

## 2023-01-13 LAB — SURGICAL PATHOLOGY

## 2023-03-09 ENCOUNTER — Encounter (INDEPENDENT_AMBULATORY_CARE_PROVIDER_SITE_OTHER): Payer: Self-pay

## 2023-03-28 ENCOUNTER — Ambulatory Visit (INDEPENDENT_AMBULATORY_CARE_PROVIDER_SITE_OTHER): Payer: No Typology Code available for payment source

## 2023-03-28 DIAGNOSIS — Z3042 Encounter for surveillance of injectable contraceptive: Secondary | ICD-10-CM | POA: Diagnosis not present

## 2023-03-28 MED ORDER — MEDROXYPROGESTERONE ACETATE 150 MG/ML IM SUSP
150.0000 mg | INTRAMUSCULAR | Status: DC
Start: 2023-03-28 — End: 2023-12-14
  Administered 2023-03-28: 150 mg via INTRAMUSCULAR

## 2023-03-28 NOTE — Progress Notes (Signed)
Per orders of Dr. Ardyth Harps, injection of Depo Provera given by Kathreen Devoid. Patient tolerated injection well.

## 2023-06-24 ENCOUNTER — Ambulatory Visit (INDEPENDENT_AMBULATORY_CARE_PROVIDER_SITE_OTHER): Payer: No Typology Code available for payment source | Admitting: *Deleted

## 2023-06-24 DIAGNOSIS — Z3042 Encounter for surveillance of injectable contraceptive: Secondary | ICD-10-CM

## 2023-06-24 MED ORDER — MEDROXYPROGESTERONE ACETATE 150 MG/ML IM SUSP
150.0000 mg | INTRAMUSCULAR | Status: DC
Start: 2023-06-24 — End: 2023-12-14
  Administered 2023-06-24: 150 mg via INTRAMUSCULAR

## 2023-06-24 NOTE — Progress Notes (Signed)
Per orders of Dr.Michael, injection of Medroxyprogesterone Acetate injectable susp 150mg  given by Johnella Moloney. Patient tolerated injection well.

## 2023-08-11 ENCOUNTER — Other Ambulatory Visit: Payer: Self-pay | Admitting: Internal Medicine

## 2023-08-11 DIAGNOSIS — Z1231 Encounter for screening mammogram for malignant neoplasm of breast: Secondary | ICD-10-CM

## 2023-08-19 ENCOUNTER — Ambulatory Visit: Payer: No Typology Code available for payment source

## 2023-08-31 ENCOUNTER — Ambulatory Visit: Payer: No Typology Code available for payment source

## 2023-09-09 ENCOUNTER — Ambulatory Visit
Admission: RE | Admit: 2023-09-09 | Discharge: 2023-09-09 | Disposition: A | Discharge status: Home / Self Care | Payer: No Typology Code available for payment source | Source: Ambulatory Visit | Attending: Internal Medicine | Admitting: Internal Medicine

## 2023-09-09 DIAGNOSIS — Z1231 Encounter for screening mammogram for malignant neoplasm of breast: Secondary | ICD-10-CM

## 2023-09-16 ENCOUNTER — Ambulatory Visit (INDEPENDENT_AMBULATORY_CARE_PROVIDER_SITE_OTHER): Payer: No Typology Code available for payment source

## 2023-09-16 DIAGNOSIS — Z3042 Encounter for surveillance of injectable contraceptive: Secondary | ICD-10-CM | POA: Diagnosis not present

## 2023-09-16 MED ORDER — MEDROXYPROGESTERONE ACETATE 150 MG/ML IM SUSP
150.0000 mg | INTRAMUSCULAR | Status: AC
Start: 2023-09-16 — End: ?
  Administered 2023-09-16 – 2023-12-09 (×2): 150 mg via INTRAMUSCULAR

## 2023-09-16 NOTE — Progress Notes (Signed)
Per orders of Dr. Martinique, injection of Depo Provera given by Rodrigo Ran. Patient tolerated injection well.

## 2023-12-09 ENCOUNTER — Ambulatory Visit (INDEPENDENT_AMBULATORY_CARE_PROVIDER_SITE_OTHER)

## 2023-12-09 DIAGNOSIS — Z3042 Encounter for surveillance of injectable contraceptive: Secondary | ICD-10-CM | POA: Diagnosis not present

## 2023-12-09 NOTE — Progress Notes (Signed)
 Patient is in office today for a nurse visit for Birth Control Injection-MedroxyPROGESTERone  ACETATE inj 150mg . Patient Injection was given in the  Right upper quad. gluteus. Patient tolerated injection well.

## 2023-12-14 ENCOUNTER — Ambulatory Visit (INDEPENDENT_AMBULATORY_CARE_PROVIDER_SITE_OTHER): Admitting: Obstetrics and Gynecology

## 2023-12-14 ENCOUNTER — Encounter: Payer: Self-pay | Admitting: Obstetrics and Gynecology

## 2023-12-14 ENCOUNTER — Other Ambulatory Visit (HOSPITAL_COMMUNITY)
Admission: RE | Admit: 2023-12-14 | Discharge: 2023-12-14 | Disposition: A | Discharge status: Home / Self Care | Source: Ambulatory Visit | Attending: Obstetrics and Gynecology | Admitting: Obstetrics and Gynecology

## 2023-12-14 VITALS — BP 122/78 | HR 76 | Temp 98.1°F | Ht 63.0 in | Wt 193.0 lb

## 2023-12-14 DIAGNOSIS — Z124 Encounter for screening for malignant neoplasm of cervix: Secondary | ICD-10-CM | POA: Insufficient documentation

## 2023-12-14 DIAGNOSIS — Z1331 Encounter for screening for depression: Secondary | ICD-10-CM | POA: Diagnosis not present

## 2023-12-14 DIAGNOSIS — N870 Mild cervical dysplasia: Secondary | ICD-10-CM | POA: Diagnosis present

## 2023-12-14 DIAGNOSIS — Z3042 Encounter for surveillance of injectable contraceptive: Secondary | ICD-10-CM

## 2023-12-14 DIAGNOSIS — Z01419 Encounter for gynecological examination (general) (routine) without abnormal findings: Secondary | ICD-10-CM

## 2023-12-14 NOTE — Assessment & Plan Note (Signed)
 Cervical cancer screening performed according to ASCCP guidelines. Encouraged annual mammogram screening Colonoscopy UTD DXA N/A Labs and immunizations with her primary Encouraged safe sexual practices as indicated Encouraged healthy lifestyle practices with diet and exercise For patients under 50-55yo, I recommend 1200mg  calcium daily and 600IU of vitamin D daily.

## 2023-12-14 NOTE — Progress Notes (Signed)
 53 y.o. G18P0003 female on Depo (contraception, PCP), CIN-1 (2001) here for annual exam. Married. Customer service.  Children are 16-?34.  No LMP recorded. Patient has had an injection.   Notes occasional night sweats and low energy.  Discussed FSH testing however patient does not want to risk pregnancy to stop hormones for testing.  Done with childbearing.  Abnormal bleeding: none, amenorrheic with Depo Pelvic discharge or pain: none Breast mass, nipple discharge or skin changes : none Birth control: Depo Last PAP:     Component Value Date/Time   DIAGPAP  10/23/2021 1654    - Negative for intraepithelial lesion or malignancy (NILM)   DIAGPAP  02/02/2021 1707    - Negative for intraepithelial lesion or malignancy (NILM)   HPVHIGH Negative 10/23/2021 1654   HPVHIGH Positive (A) 02/02/2021 1707   ADEQPAP  10/23/2021 1654    Satisfactory for evaluation; transformation zone component PRESENT.   ADEQPAP  02/02/2021 1707    Satisfactory for evaluation; transformation zone component PRESENT.   Last mammogram: 09/09/23 BI-RADS 1, density B Last colonoscopy: 05/29/19 Sexually active: no  Exercising: no Smoker: no  Garment/textile technologist Visit from 12/14/2023 in [facility]  PHQ-2 Total Score 1       Flowsheet Row Office Visit from 12/30/2022 in [facility] [place]  PHQ-9 Total Score 2        GYN HISTORY: CIN-1, 2001 History of cryoablation approximately 2010  OB History  Gravida Para Term Preterm AB Living  3 3   0 3  SAB IAB Ectopic Multiple Live Births    0      # Outcome Date GA Lbr Len/2nd Weight Sex Type Anes PTL Lv  3 Para           2 Para           1 Para             Past Medical History:  Diagnosis Date   Abnormal Pap smear of cervix    Allergy    Anemia    as teenager   Anxiety    due to COVID    Gallbladder sludge    dx in 2015 per pt, pt refused surgery   Migraines    Streptococcal sore throat  05/31/2013    Past Surgical History:  Procedure Laterality Date   CESAREAN SECTION  2008   CHOLECYSTECTOMY N/A 01/12/2023   Procedure: LAPAROSCOPIC CHOLECYSTECTOMY WITH ICG DYE;  Surgeon: Enid Harry, MD;  Location: [facility];  Service: General;  Laterality: N/A;    Current Outpatient Medications on File Prior to Visit  Medication Sig Dispense Refill   acetaminophen  (TYLENOL ) 325 MG tablet Take 650 mg by mouth every 6 (six) hours as needed for headache or moderate pain.     Cholecalciferol (VITAMIN D3 PO) Take 1 tablet by mouth daily.     magnesium aspartate (MAGINEX) 615 MG tablet Take 615 mg by mouth 2 (two) times daily.     medroxyPROGESTERone  (DEPO-PROVERA ) 150 MG/ML injection Inject 1 mL (150 mg total) into the muscle every 3 (three) months. 1 mL 4   Menaquinone-7 (VITAMIN K2 PO) Take by mouth.     MILK THISTLE PO Take 1 tablet by mouth daily.     Multiple Vitamin (MULTIVITAMIN) tablet Take 1 tablet by mouth daily.     Omega-3 Fatty Acids (FISH OIL PO) Take 1 capsule by mouth daily.     Current Facility-Administered Medications on File  Prior to Visit  Medication Dose Route Frequency Provider Last Rate Last Admin   medroxyPROGESTERone  (DEPO-PROVERA ) injection 150 mg  150 mg Intramuscular Q90 days [place], Betty G, MD   150 mg at 12/09/23 0813    Social History   Socioeconomic History   Marital status: Married    Spouse name: Not on file   Number of children: Not on file   Years of education: Not on file   Highest education level: Not on file  Occupational History   Not on file  Tobacco Use   Smoking status: Never   Smokeless tobacco: Never  Vaping Use   Vaping status: Never Used  Substance and Sexual Activity   Alcohol use: Not Currently   Drug use: Never   Sexual activity: Yes    Partners: Male    Birth control/protection: Injection    Comment: 1st. intercourse- 16, partners- 4  Other Topics Concern   Not on file  Social History Narrative   Not on file    Social Drivers of Health   Financial Resource Strain: Not on file  Food Insecurity: Not on file  Transportation Needs: Not on file  Physical Activity: Not on file  Stress: Not on file  Social Connections: Not on file  Intimate Partner Violence: Not on file    Family History  Problem Relation Age of Onset   Heart defect Mother        recently admitted to [facility] per patient-needs valve replacement   Hypertension Father    Breast cancer Paternal Grandmother    Colon cancer Neg Hx    Colon polyps Neg Hx    Esophageal cancer Neg Hx    Rectal cancer Neg Hx    Stomach cancer Neg Hx     No Known Allergies    PE Today's Vitals   12/14/23 0801  BP: 122/78  Pulse: 76  Temp: 98.1 F (36.7 C)  TempSrc: Oral  SpO2: 99%  Weight: 193 lb (87.5 kg)  Height: 5\' 3"  (1.6 m)   Body mass index is 34.19 kg/m.  Physical Exam Vitals reviewed. Exam conducted with a chaperone present.  Constitutional:      General: She is not in acute distress.    Appearance: Normal appearance.  HENT:     Head: Normocephalic and atraumatic.     Nose: Nose normal.  Eyes:     Extraocular Movements: Extraocular movements intact.     Conjunctiva/sclera: Conjunctivae normal.  Neck:     Thyroid : No thyroid  mass, thyromegaly or thyroid  tenderness.  Pulmonary:     Effort: Pulmonary effort is normal.  Chest:     Chest wall: No mass or tenderness.  Breasts:    Right: Normal. No swelling, mass, nipple discharge, skin change or tenderness.     Left: Normal. No swelling, mass, nipple discharge, skin change or tenderness.  Abdominal:     General: There is no distension.     Palpations: Abdomen is soft.     Tenderness: There is no abdominal tenderness.  Genitourinary:    General: Normal vulva.     Exam position: Lithotomy position.     Urethra: No prolapse.     Vagina: Normal. No vaginal discharge or bleeding.     Cervix: Normal. No lesion.     Uterus: Normal. Not enlarged and not tender.       Adnexa: Right adnexa normal and left adnexa normal.  Musculoskeletal:        General: Normal range of motion.  Cervical back: Normal range of motion.  Lymphadenopathy:     Upper Body:     Right upper body: No axillary adenopathy.     Left upper body: No axillary adenopathy.     Lower Body: No right inguinal adenopathy. No left inguinal adenopathy.  Skin:    General: Skin is warm and dry.  Neurological:     General: No focal deficit present.     Mental Status: She is alert.  Psychiatric:        Mood and Affect: Mood normal.        Behavior: Behavior normal.      Assessment and Plan:        Well female exam with routine gynecological exam Assessment & Plan: Cervical cancer screening performed according to ASCCP guidelines. Encouraged annual mammogram screening Colonoscopy UTD DXA N/A Labs and immunizations with her primary Encouraged safe sexual practices as indicated Encouraged healthy lifestyle practices with diet and exercise For patients under 50-70yo, I recommend 1200mg  calcium daily and 600IU of vitamin D  daily.    Cervical cancer screening -     Cytology - PAP  Dysplasia of cervix, low grade (CIN 1) -     Cytology - PAP  Encounter for surveillance of injectable contraceptive  Negative depression screening   Romaine Closs, MD

## 2023-12-14 NOTE — Patient Instructions (Signed)

## 2023-12-16 LAB — CYTOLOGY - PAP
Comment: NEGATIVE
Diagnosis: NEGATIVE
High risk HPV: NEGATIVE

## 2023-12-19 ENCOUNTER — Encounter: Payer: Self-pay | Admitting: Obstetrics and Gynecology

## 2024-03-01 ENCOUNTER — Ambulatory Visit

## 2024-03-01 DIAGNOSIS — Z3042 Encounter for surveillance of injectable contraceptive: Secondary | ICD-10-CM | POA: Diagnosis not present

## 2024-03-01 MED ORDER — MEDROXYPROGESTERONE ACETATE 150 MG/ML IM SUSY
PREFILLED_SYRINGE | Freq: Once | INTRAMUSCULAR | Status: AC
Start: 2024-03-01 — End: 2024-03-01

## 2024-03-01 NOTE — Progress Notes (Signed)
 Patient is in office today for a nurse visit for Birth Control Injection-MedroxyProgesterone  Acetate Injectable sus 150mg . Patient Injection was given in the  Left upper quad. gluteus. Patient tolerated injection well.

## 2024-05-25 ENCOUNTER — Ambulatory Visit

## 2024-05-25 DIAGNOSIS — Z3042 Encounter for surveillance of injectable contraceptive: Secondary | ICD-10-CM

## 2024-05-25 MED ADMIN — Medroxyprogesterone Acetate IM Susp Prefilled Syr 150 MG/ML: 150 mg | INTRAMUSCULAR | NDC 00548570100

## 2024-05-25 NOTE — Progress Notes (Signed)
Patient is in office today for a nurse visit for Birth Control Injection. Patient Injection was given in the  Right upper quad. gluteus. Patient tolerated injection well.

## 2024-08-17 ENCOUNTER — Ambulatory Visit: Admitting: *Deleted

## 2024-08-17 DIAGNOSIS — Z3042 Encounter for surveillance of injectable contraceptive: Secondary | ICD-10-CM

## 2024-08-17 MED ORDER — MEDROXYPROGESTERONE ACETATE 150 MG/ML IM SUSP
150.0000 mg | INTRAMUSCULAR | Status: AC
  Administered 2024-08-17: 150 mg via INTRAMUSCULAR

## 2024-08-17 NOTE — Progress Notes (Signed)
 Per orders of Dr. Ozell, injection of Medroxyprogesterone  Acetate Suspension 150mg  given by Christyne Idell LABOR. Patient tolerated injection well.
